# Patient Record
Sex: Male | Born: 1950 | Race: Black or African American | Hispanic: No | Smoking: Former smoker
Health system: Southern US, Community
[De-identification: ages and names within clinical notes are randomized; demographics above are authoritative.]

## PROBLEM LIST (undated history)

## (undated) DIAGNOSIS — Z972 Presence of dental prosthetic device (complete) (partial): Secondary | ICD-10-CM

## (undated) DIAGNOSIS — IMO0001 Reserved for inherently not codable concepts without codable children: Secondary | ICD-10-CM

## (undated) DIAGNOSIS — J302 Other seasonal allergic rhinitis: Secondary | ICD-10-CM

## (undated) DIAGNOSIS — I10 Essential (primary) hypertension: Secondary | ICD-10-CM

## (undated) DIAGNOSIS — J449 Chronic obstructive pulmonary disease, unspecified: Secondary | ICD-10-CM

## (undated) DIAGNOSIS — N400 Enlarged prostate without lower urinary tract symptoms: Secondary | ICD-10-CM

## (undated) DIAGNOSIS — M1990 Unspecified osteoarthritis, unspecified site: Secondary | ICD-10-CM

## (undated) DIAGNOSIS — J45909 Unspecified asthma, uncomplicated: Secondary | ICD-10-CM

## (undated) DIAGNOSIS — K219 Gastro-esophageal reflux disease without esophagitis: Secondary | ICD-10-CM

## (undated) HISTORY — PX: CIRCUMCISION: SUR203

## (undated) HISTORY — PX: DENTAL SURGERY: SHX609

---

## 2005-02-23 ENCOUNTER — Emergency Department: Payer: Self-pay | Admitting: Unknown Physician Specialty

## 2005-03-28 ENCOUNTER — Emergency Department: Payer: Self-pay | Admitting: General Practice

## 2006-05-29 ENCOUNTER — Emergency Department: Payer: Self-pay | Admitting: Emergency Medicine

## 2006-10-11 ENCOUNTER — Emergency Department: Payer: Self-pay

## 2010-02-01 ENCOUNTER — Emergency Department: Payer: Self-pay | Admitting: Emergency Medicine

## 2010-05-06 ENCOUNTER — Emergency Department: Payer: Self-pay | Admitting: Emergency Medicine

## 2010-05-07 ENCOUNTER — Emergency Department: Payer: Self-pay | Admitting: Emergency Medicine

## 2010-05-29 ENCOUNTER — Ambulatory Visit: Payer: Self-pay | Admitting: Family Medicine

## 2010-12-26 ENCOUNTER — Emergency Department: Payer: Self-pay | Admitting: Emergency Medicine

## 2011-02-12 ENCOUNTER — Emergency Department: Payer: Self-pay | Admitting: Emergency Medicine

## 2011-10-13 ENCOUNTER — Inpatient Hospital Stay: Payer: Self-pay | Admitting: Internal Medicine

## 2011-10-28 ENCOUNTER — Emergency Department: Payer: Self-pay | Admitting: Emergency Medicine

## 2011-11-14 ENCOUNTER — Ambulatory Visit: Payer: Self-pay | Admitting: Internal Medicine

## 2012-03-12 ENCOUNTER — Emergency Department: Payer: Self-pay | Admitting: Emergency Medicine

## 2012-03-12 LAB — COMPREHENSIVE METABOLIC PANEL
Albumin: 4.4 g/dL (ref 3.4–5.0)
Alkaline Phosphatase: 37 U/L — ABNORMAL LOW (ref 50–136)
Anion Gap: 10 (ref 7–16)
BUN: 18 mg/dL (ref 7–18)
Calcium, Total: 9 mg/dL (ref 8.5–10.1)
Chloride: 98 mmol/L (ref 98–107)
Co2: 28 mmol/L (ref 21–32)
EGFR (Non-African Amer.): 47 — ABNORMAL LOW
SGOT(AST): 25 U/L (ref 15–37)
Total Protein: 7.3 g/dL (ref 6.4–8.2)

## 2012-03-12 LAB — CBC
HCT: 45.9 % (ref 40.0–52.0)
HGB: 14.7 g/dL (ref 13.0–18.0)
MCHC: 32 g/dL (ref 32.0–36.0)
Platelet: 184 10*3/uL (ref 150–440)
RDW: 13.4 % (ref 11.5–14.5)

## 2012-12-20 ENCOUNTER — Inpatient Hospital Stay: Payer: Self-pay | Admitting: Internal Medicine

## 2012-12-20 LAB — COMPREHENSIVE METABOLIC PANEL WITH GFR
Albumin: 3.9 g/dL (ref 3.4–5.0)
Alkaline Phosphatase: 69 U/L (ref 50–136)
Anion Gap: 8 (ref 7–16)
BUN: 23 mg/dL — ABNORMAL HIGH (ref 7–18)
Bilirubin,Total: 1.5 mg/dL — ABNORMAL HIGH (ref 0.2–1.0)
Calcium, Total: 9.1 mg/dL (ref 8.5–10.1)
Chloride: 100 mmol/L (ref 98–107)
Co2: 29 mmol/L (ref 21–32)
Creatinine: 1.41 mg/dL — ABNORMAL HIGH (ref 0.60–1.30)
EGFR (African American): >60
EGFR (Non-African Amer.): 53 — ABNORMAL LOW
Glucose: 101 mg/dL — ABNORMAL HIGH (ref 65–99)
Osmolality: 278 (ref 275–301)
Potassium: 3.8 mmol/L (ref 3.5–5.1)
SGOT(AST): 29 U/L (ref 15–37)
SGPT (ALT): 23 U/L (ref 12–78)
Sodium: 137 mmol/L (ref 136–145)
Total Protein: 8 g/dL (ref 6.4–8.2)

## 2012-12-20 LAB — CBC
HCT: 53.5 % — ABNORMAL HIGH (ref 40.0–52.0)
HGB: 17.3 g/dL (ref 13.0–18.0)
MCH: 28.5 pg (ref 26.0–34.0)
MCHC: 32.3 g/dL (ref 32.0–36.0)
MCV: 88 fL (ref 80–100)
Platelet: 177 x10 3/mm 3 (ref 150–440)
RBC: 6.08 x10 6/mm 3 — ABNORMAL HIGH (ref 4.40–5.90)
RDW: 15.7 % — ABNORMAL HIGH (ref 11.5–14.5)
WBC: 15 x10 3/mm 3 — ABNORMAL HIGH (ref 3.8–10.6)

## 2012-12-20 LAB — RAPID INFLUENZA A&B ANTIGENS

## 2012-12-20 LAB — MAGNESIUM: Magnesium: 1.9 mg/dL

## 2012-12-20 LAB — TROPONIN I: Troponin-I: <0.02 ng/mL

## 2012-12-21 LAB — CBC WITH DIFFERENTIAL/PLATELET
Basophil #: 0 10*3/uL (ref 0.0–0.1)
Basophil %: 0.4 %
Eosinophil %: 0 %
HCT: 47.5 % (ref 40.0–52.0)
HGB: 15.5 g/dL (ref 13.0–18.0)
Lymphocyte #: 0.6 10*3/uL — ABNORMAL LOW (ref 1.0–3.6)
MCV: 88 fL (ref 80–100)
Monocyte #: 0.2 x10 3/mm (ref 0.2–1.0)
Monocyte %: 2.3 %
Neutrophil #: 9.6 10*3/uL — ABNORMAL HIGH (ref 1.4–6.5)
Platelet: 158 10*3/uL (ref 150–440)
RBC: 5.42 10*6/uL (ref 4.40–5.90)
WBC: 10.5 10*3/uL (ref 3.8–10.6)

## 2012-12-21 LAB — BASIC METABOLIC PANEL
Chloride: 100 mmol/L (ref 98–107)
Co2: 24 mmol/L (ref 21–32)
EGFR (African American): 51 — ABNORMAL LOW
EGFR (Non-African Amer.): 44 — ABNORMAL LOW
Glucose: 186 mg/dL — ABNORMAL HIGH (ref 65–99)
Osmolality: 276 (ref 275–301)
Potassium: 4 mmol/L (ref 3.5–5.1)
Sodium: 132 mmol/L — ABNORMAL LOW (ref 136–145)

## 2012-12-23 LAB — BASIC METABOLIC PANEL
Anion Gap: 5 — ABNORMAL LOW (ref 7–16)
BUN: 23 mg/dL — ABNORMAL HIGH (ref 7–18)
Co2: 26 mmol/L (ref 21–32)
EGFR (African American): >60
EGFR (Non-African Amer.): >60
Glucose: 143 mg/dL — ABNORMAL HIGH (ref 65–99)
Sodium: 136 mmol/L (ref 136–145)

## 2012-12-26 LAB — CULTURE, BLOOD (SINGLE)

## 2014-02-17 ENCOUNTER — Emergency Department: Payer: Self-pay | Admitting: Emergency Medicine

## 2014-03-03 ENCOUNTER — Emergency Department: Payer: Self-pay | Admitting: Emergency Medicine

## 2014-03-03 LAB — CBC
HCT: 49.7 % (ref 40.0–52.0)
HGB: 15.4 g/dL (ref 13.0–18.0)
MCH: 27.2 pg (ref 26.0–34.0)
MCHC: 31 g/dL — AB (ref 32.0–36.0)
MCV: 88 fL (ref 80–100)
PLATELETS: 213 10*3/uL (ref 150–440)
RBC: 5.67 10*6/uL (ref 4.40–5.90)
RDW: 14.8 % — AB (ref 11.5–14.5)
WBC: 14.8 10*3/uL — ABNORMAL HIGH (ref 3.8–10.6)

## 2014-03-03 LAB — BASIC METABOLIC PANEL
Anion Gap: 5 — ABNORMAL LOW (ref 7–16)
BUN: 18 mg/dL (ref 7–18)
Calcium, Total: 9.5 mg/dL (ref 8.5–10.1)
Chloride: 100 mmol/L (ref 98–107)
Co2: 31 mmol/L (ref 21–32)
Creatinine: 1.78 mg/dL — ABNORMAL HIGH (ref 0.60–1.30)
EGFR (Non-African Amer.): 40 — ABNORMAL LOW
GFR CALC AF AMER: 46 — AB
GLUCOSE: 112 mg/dL — AB (ref 65–99)
OSMOLALITY: 275 (ref 275–301)
Potassium: 4 mmol/L (ref 3.5–5.1)
Sodium: 136 mmol/L (ref 136–145)

## 2014-03-03 LAB — URINALYSIS, COMPLETE
BACTERIA: NONE SEEN
BILIRUBIN, UR: NEGATIVE
GLUCOSE, UR: NEGATIVE mg/dL (ref 0–75)
NITRITE: POSITIVE
PH: 8 (ref 4.5–8.0)
Protein: 100
RBC,UR: 147 /HPF (ref 0–5)
Specific Gravity: 1.016 (ref 1.003–1.030)
Squamous Epithelial: NONE SEEN
WBC UR: 1492 /HPF (ref 0–5)

## 2014-03-05 LAB — URINE CULTURE

## 2014-03-12 DIAGNOSIS — N401 Enlarged prostate with lower urinary tract symptoms: Secondary | ICD-10-CM | POA: Insufficient documentation

## 2014-08-18 ENCOUNTER — Emergency Department: Payer: Self-pay | Admitting: Emergency Medicine

## 2014-08-18 LAB — CBC WITH DIFFERENTIAL/PLATELET
Basophil #: 0.1 10*3/uL (ref 0.0–0.1)
Basophil %: 1 %
Eosinophil #: 0 10*3/uL (ref 0.0–0.7)
Eosinophil %: 0.3 %
HCT: 44.8 % (ref 40.0–52.0)
HGB: 14.2 g/dL (ref 13.0–18.0)
Lymphocyte #: 0.6 10*3/uL — ABNORMAL LOW (ref 1.0–3.6)
Lymphocyte %: 9.3 %
MCH: 27.7 pg (ref 26.0–34.0)
MCHC: 31.7 g/dL — AB (ref 32.0–36.0)
MCV: 88 fL (ref 80–100)
Monocyte #: 0.4 x10 3/mm (ref 0.2–1.0)
Monocyte %: 5.7 %
Neutrophil #: 5.6 10*3/uL (ref 1.4–6.5)
Neutrophil %: 83.7 %
PLATELETS: 202 10*3/uL (ref 150–440)
RBC: 5.12 10*6/uL (ref 4.40–5.90)
RDW: 14.5 % (ref 11.5–14.5)
WBC: 6.7 10*3/uL (ref 3.8–10.6)

## 2014-08-18 LAB — BASIC METABOLIC PANEL
Anion Gap: 7 (ref 7–16)
BUN: 20 mg/dL — AB (ref 7–18)
Calcium, Total: 9.2 mg/dL (ref 8.5–10.1)
Chloride: 100 mmol/L (ref 98–107)
Co2: 26 mmol/L (ref 21–32)
Creatinine: 1.39 mg/dL — ABNORMAL HIGH (ref 0.60–1.30)
EGFR (African American): >60
EGFR (Non-African Amer.): 55 — ABNORMAL LOW
GLUCOSE: 153 mg/dL — AB (ref 65–99)
Osmolality: 272 (ref 275–301)
Potassium: 4.1 mmol/L (ref 3.5–5.1)
Sodium: 133 mmol/L — ABNORMAL LOW (ref 136–145)

## 2014-08-18 LAB — URINALYSIS, COMPLETE
Bacteria: NONE SEEN
Bilirubin,UR: NEGATIVE
GLUCOSE, UR: NEGATIVE mg/dL (ref 0–75)
Hyaline Cast: 2
LEUKOCYTE ESTERASE: NEGATIVE
NITRITE: NEGATIVE
Ph: 5 (ref 4.5–8.0)
SPECIFIC GRAVITY: 1.015 (ref 1.003–1.030)
Squamous Epithelial: 1

## 2014-08-18 LAB — TROPONIN I: Troponin-I: <0.02 ng/mL

## 2015-03-04 NOTE — H&P (Signed)
PATIENT NAME:  Frank Todd, Kao H MR#:  [id number] DATE OF BIRTH:  01-05-1951  DATE OF ADMISSION:  12/20/2012   PRIMARY CARE PHYSICIAN:  [facility]  PULMONOLOGIST:  Dr. Carolynne EdouardSadaat Khan  ED REFERRING PHYSICIAN:  Dr. Shaune PollackLord  CHIEF COMPLAINT:  Shortness of breath, cough.   HISTORY OF PRESENT ILLNESS:  The patient is a 64 year old African American male with history of COPD who reports that he has not been feeling well for the past few days. He has had progressive cough with yellow-greenish sputum. He also has had progressive shortness of breath. He normally at baseline is chronically short of breath, but the shortness of breath is worse over the past few days. He has been also wheezing. He has had fevers but has not checked his temperature. The patient does report that he had some antibiotics left over from before. He thinks that it was sulfa antibiotics or likely Bactrim which he took for a few days. He also has prednisone which he has been taking as well without any significant improvement. When he came to the ED his sats were 84% on room air. The patient otherwise denies any chest pains, palpitations, denies any body aches or joint aches. Denies any nausea, vomiting or diarrhea.   PAST MEDICAL HISTORY: 1.  COPD. 2.  Hypertension.  3.  Erectile dysfunction.   PAST SURGICAL HISTORY:  None.   ALLERGIES:  None.   CURRENT MEDICATIONS:  Combivent 2 to 4 puffs q. 6 p.r.n., Spiriva daily, metoprolol extended release 25 p.o. daily. Prednisone, currently he is taking 10 mg. He states that he usually chronically takes steroids. When he runs out, he goes to his primary care provider and they prescribe him prednisone. He reports that without the prednisone, he is unable to breathe and feels very weak. He is on amlodipine 10 daily. Cialis p.r.n. He says he is albuterol nebulizer q. 6 p.r.n.  I am not sure if he is on ipratropium.   SOCIAL HISTORY:  Denies any smoking. States that his COPD is from second-hand  smoke. He reports that he drinks mixed drinks on a daily basis. Denies any drug use. Father had lung cancer. Her mother had lung cancer.  REVIEW OF SYSTEMS:  CONSTITUTIONAL:  Complains of fevers, fatigueness, weakness. No pain. No weight loss. No weight gain.  EYES:  No blurred or double vision. No pain. No redness. No inflammation. No glaucoma. No cataracts.  ENT:  No tinnitus. No ear pain. No hearing loss. No difficulty swallowing.  RESPIRATORY:  Complains of cough, wheezing. No hemoptysis. Has dyspnea. Has COPD.  CARDIOVASCULAR:  Denies any chest pain. No orthopnea. No edema.  GASTROINTESTINAL:  No nausea, vomiting, diarrhea. No abdominal pain. No hematemesis. No changes in bowel habits.  GENITOURINARY:  Denies any dysuria, hematuria, renal calculus or frequency. ENDOCRINE:  Denies any polyuria, nocturia, or thyroid problems.  HEMATOLOGIC AND LYMPHATIC:  Denies anemia, easy bruisability or bleeding.  SKIN:  No acne. No rash. No changes in mole, hair or skin.  MUSCULOSKELETAL:  Denies any pain in the neck, back or shoulder.  NEUROLOGIC: No numbness. No CVA. No TIA. No seizures.  PSYCHIATRIC:  No anxiety. No insomnia. No ADD.   PHYSICAL EXAMINATION: VITAL SIGNS:  Temperature 101.2, pulse 130, respirations 18. Blood pressure was 130/76.  GENERAL:  The patient is an [place] male, appears in mild distress due to his respiratory issues.  HEENT: Head atraumatic, normocephalic. Pupils equally round and react to light and accommodation. There is no conjunctival  pallor. No scleral icterus. Nasal exam shows no drainage or ulceration.  Oropharynx is clear without any exudate.  NECK:  No thyromegaly. No carotid bruits.  CARDIOVASCULAR:  Regular rate and rhythm. Tachycardic. No murmurs, rubs, clicks or gallops. PMI is not displaced.  LUNGS:  He has right-sided wheezing in the mid lung. Some rhonchi, diminished breath sounds bilaterally. Mild accessory muscle usage.  ABDOMEN:  Soft, nontender,  nondistended. Positive bowel sounds x 4.  EXTREMITIES:  No clubbing, cyanosis or edema.  SKIN:  He has some dry, scaly bilateral lower extremity plaque-like lesions which are chronic. LYMPHATICS:  No lymph nodes palpable.  VASCULAR:  Good DP, PT pulses.  PSYCHIATRIC:  Not anxious or depressed.  NEUROLOGICAL:  Awake, alert, oriented x 3. No focal deficits.   LABORATORY RESULTS:  WBC 15.0, hemoglobin 17.3, platelet count 177. LFTs were normal except a bilirubin total of 1.5. Influenza A and B were negative. His BMP:  Glucose 101, BUN 23, creatinine 1.41, sodium 137, potassium 3.8, chloride 100, CO2 is 29.   ASSESSMENT AND PLAN: The patient is a 64 year old African American male with history of chronic obstructive pulmonary disease presents with shortness of breath, cough, fever. Chest x-ray suggestive of a right middle lobe infiltrate.   1.  Acute respiratory failure on chronic respiratory failure likely due to combination of chronic obstructive pulmonary disease exacerbation, as well as pneumonia. At this time we will treat him with nebulizers, IV Solu-Medrol, as well as IV antibiotics with Levaquin and we will get sputum cultures, blood cultures. Will continue Spiriva as taken at home. The patient appears to be chronically on prednisone which will need to continue. If he does not improve, we will consult Dr. Freda Munro, his pulmonologist. 2.  Sinus tachycardia, likely due to nebulizers, as well as his respiratory status. At this time we will monitor him on telemetry. We will continue his Toprol-XL as he is taking at home.  3.  Daily alcohol use, possible alcohol abuse. I will place him on CIWA protocol.  4.  Hypertension:  I will continue metoprolol for time being. Hold Norvasc.  5.  Miscellaneous. I will place him on Lovenox for deep vein thrombosis prophylaxis.   TIME SPENT:  45 minutes   ____________________________ Serita Grit H. Allena Katz, MD shp:ce D: 12/20/2012 15:29:01 ET T: 12/20/2012  16:13:59 ET JOB#: [id number]  cc: Nobuo Nunziata H. Allena Katz, MD, <Dictator> Charise Carwin MD ELECTRONICALLY SIGNED 12/23/2012 10:20

## 2015-03-04 NOTE — Discharge Summary (Signed)
PATIENT NAME:  Frank Todd, Frank Todd MR#:  [id number] DATE OF BIRTH:  [birth year]  DATE OF ADMISSION:  12/20/2012 DATE OF DISCHARGE:  12/24/2012  DISCHARGE DIAGNOSIS: Acute on chronic respiratory failure likely due to a combination of chronic obstructive pulmonary disease exacerbation as well as left lower lobe pneumonia, slowly improving.   SECONDARY DIAGNOSES: 1. Chronic obstructive pulmonary disease.  2. Hypertension.  3. Erectile dysfunction.   LABORATORY AND RADIOLOGICAL DATA:  Chest x-ray on February 8th showed mild left lower lobe infiltrate.  Chest x-ray on February 10th showed mild left basilar opacities slightly decreased from previous x-ray.  Blood cultures x2 were negative. Influenza A and B were negative.   HISTORY AND SHORT HOSPITAL COURSE: The patient is a 64 year old male with the above-mentioned medical problems, was admitted for acute on chronic respiratory failure thought to be secondary to chronic obstructive pulmonary disease exacerbation and/or left lower lobe pneumonia. Please see Dr. Serita GritShreyang Patel's dictated history and physical for further details. The patient had a good improvement on steroid, antibiotic and nebulizer breathing treatments, was close to his baseline on February 12th and was discharged home in stable condition. The patient did have very slow improvement but was feeling comfortable to go home with all Home Health help and was agreeable to the discharge plan.   PERTINENT DISCHARGE PHYSICAL EXAMINATION: VITAL SIGNS: On the date of discharge, temperature was 97.7, heart rate 80 per minute, respirations 22 per minute, blood pressure 138/72 mmHg.  He was saturating 95% on 2.5 to 3 liters oxygen via nasal cannula.  CARDIOVASCULAR: S1, S2 normal. No murmurs, rubs, or gallop.  LUNGS: Decreased breath sounds at the bases. No wheezing, rales, rhonchi, or crepitation.  ABDOMEN: Soft, benign.  NEUROLOGIC: Nonfocal examination.  All other physical examination remained at  baseline.   DISCHARGE MEDICATIONS: 1. Amlodipine 10 mg p.o. daily.  2. Combivent 1 puff twice a day.  3. Prednisone 60 mg p.o. daily, taper x 5 mg daily until finished.  4. Advair 500/50, 1 puff inhaled twice a day.  5. Combivent 1 inhalation 4 times a day as needed.  6. Spiriva once daily.  7. Guaifenesin 600 mg p.o. b.i.d.  8. Levaquin 500 mg p.o. daily for the rest of the course as prescribed.  9. Chlorpheniramine/hydrocodone 5 mL twice a day as needed.   DISCHARGE DIET: Regular.   DISCHARGE ACTIVITY: As tolerated.   DISCHARGE INSTRUCTIONS AND FOLLOWUP:  The patient was [facility] to 2 [facility]. He will need followup with Dr. Freda MunroSaadat Khan from pulmonary in 2 to 4 weeks. He will get 2 liters oxygen via nasal cannula continuous set up by Care Management. He was also set up to get Home Health nurse and nursing aide.   TOTAL TIME DISCHARGING THIS PATIENT: 55 minutes.  ____________________________ Ellamae SiaVipul S. Sherryll BurgerShah, MD vss:cb D: 12/27/2012 15:17:08 ET T: 12/28/2012 08:09:50 ET JOB#: [id number]  cc: Adriene Knipfer S. Sherryll BurgerShah, MD, <Dictator> [facility] Yevonne PaxSaadat A. Khan, MD Ellamae SiaVIPUL S [facility] MD ELECTRONICALLY SIGNED 12/29/2012 19:24

## 2015-03-25 ENCOUNTER — Encounter: Payer: Self-pay | Admitting: *Deleted

## 2015-03-28 NOTE — Discharge Instructions (Signed)
Follow-Up Appointment is: Thursday, May 19 @ 11:05 am    Cataract Surgery Care After Refer to this sheet in the next few weeks. These instructions provide you with information on caring for yourself after your procedure. Your caregiver may also give you more specific instructions. Your treatment has been planned according to current medical practices, but problems sometimes occur. Call your caregiver if you have any problems or questions after your procedure.  HOME CARE INSTRUCTIONS   Avoid strenuous activities as directed by your caregiver.  Ask your caregiver when you can resume driving.  Use eyedrops or other medicines to help healing and control pressure inside your eye as directed by your caregiver.  Only take over-the-counter or prescription medicines for pain, discomfort, or fever as directed by your caregiver.  Do not to touch or rub your eyes.  You may be instructed to use a protective shield during the first few days and nights after surgery. If not, wear sunglasses to protect your eyes. This is to protect the eye from pressure or from being accidentally bumped.  Keep the area around your eye clean and dry. Avoid swimming or allowing water to hit you directly in the face while showering. Keep soap and shampoo out of your eyes.  Do not bend or lift heavy objects. Bending increases pressure in the eye. You can walk, climb stairs, and do light household chores.  Do not put a contact lens into the eye that had surgery until your caregiver says it is okay to do so.  Ask your doctor when you can return to work. This will depend on the kind of work that you do. If you work in a dusty environment, you may be advised to wear protective eyewear for a period of time.  Ask your caregiver when it will be safe to engage in sexual activity.  Continue with your regular eye exams as directed by your caregiver. What to expect:  It is normal to feel itching and mild discomfort for a few days  after cataract surgery. Some fluid discharge is also common, and your eye may be sensitive to light and touch.  After 1 to 2 days, even moderate discomfort should disappear. In most cases, healing will take about 6 weeks.  If you received an intraocular lens (IOL), you may notice that colors are very bright or have a blue tinge. Also, if you have been in bright sunlight, everything may appear reddish for a few hours. If you see these color tinges, it is because your lens is clear and no longer cloudy. Within a few months after receiving an IOL, these extra colors should go away. When you have healed, you will probably need new glasses. SEEK MEDICAL CARE IF:   You have increased bruising around your eye.  You have discomfort not helped by medicine. SEEK IMMEDIATE MEDICAL CARE IF:   You have a fever.  You have a worsening or sudden vision loss.  You have redness, swelling, or increasing pain in the eye.  You have a thick discharge from the eye that had surgery. MAKE SURE YOU:  Understand these instructions.  Will watch your condition.  Will get help right away if you are not doing well or get worse. Document Released: 05/18/2005 Document Revised: 01/21/2012 Document Reviewed: 06/22/2011 [facility] Patient Information 2015 [place]. This information is not intended to replace advice given to you by your health care provider. Make sure you discuss any questions you have with your health care provider.  General Anesthesia, Care After Refer to this sheet in the next few weeks. These instructions provide you with information on caring for yourself after your procedure. Your health care provider may also give you more specific instructions. Your treatment has been planned according to current medical practices, but problems sometimes occur. Call your health care provider if you have any problems or questions after your procedure. WHAT TO EXPECT AFTER THE PROCEDURE After the procedure,  it is typical to experience:  Sleepiness.  Nausea and vomiting. HOME CARE INSTRUCTIONS  For the first 24 hours after general anesthesia:  Have a responsible person with you.  Do not drive a car. If you are alone, do not take public transportation.  Do not drink alcohol.  Do not take medicine that has not been prescribed by your health care provider.  Do not sign important papers or make important decisions.  You may resume a normal diet and activities as directed by your health care provider.  Change bandages (dressings) as directed.  If you have questions or problems that seem related to general anesthesia, call the hospital and ask for the anesthetist or anesthesiologist on call. SEEK MEDICAL CARE IF:  You have nausea and vomiting that continue the day after anesthesia.  You develop a rash. SEEK IMMEDIATE MEDICAL CARE IF:   You have difficulty breathing.  You have chest pain.  You have any allergic problems. Document Released: 02/04/2001 Document Revised: 11/03/2013 Document Reviewed: 05/14/2013 [facility] Patient Information 2015 [place], [place]. This information is not intended to replace advice given to you by your health care provider. Make sure you discuss any questions you have with your health care provider.

## 2015-03-30 ENCOUNTER — Encounter: Payer: Self-pay | Admitting: Anesthesiology

## 2015-03-30 ENCOUNTER — Ambulatory Visit: Payer: Medicare Other | Admitting: Anesthesiology

## 2015-03-30 ENCOUNTER — Ambulatory Visit
Admission: RE | Admit: 2015-03-30 | Discharge: 2015-03-30 | Disposition: A | Discharge status: Home / Self Care | Payer: Medicare Other | Source: Ambulatory Visit | Attending: Ophthalmology | Admitting: Ophthalmology

## 2015-03-30 ENCOUNTER — Encounter
Admission: RE | Disposition: A | Discharge status: Home / Self Care | Payer: Self-pay | Source: Ambulatory Visit | Attending: Ophthalmology

## 2015-03-30 DIAGNOSIS — H2511 Age-related nuclear cataract, right eye: Secondary | ICD-10-CM | POA: Diagnosis not present

## 2015-03-30 DIAGNOSIS — J42 Unspecified chronic bronchitis: Secondary | ICD-10-CM | POA: Insufficient documentation

## 2015-03-30 DIAGNOSIS — Z87891 Personal history of nicotine dependence: Secondary | ICD-10-CM | POA: Insufficient documentation

## 2015-03-30 DIAGNOSIS — H269 Unspecified cataract: Secondary | ICD-10-CM | POA: Diagnosis present

## 2015-03-30 DIAGNOSIS — Z9981 Dependence on supplemental oxygen: Secondary | ICD-10-CM | POA: Insufficient documentation

## 2015-03-30 DIAGNOSIS — M1990 Unspecified osteoarthritis, unspecified site: Secondary | ICD-10-CM | POA: Insufficient documentation

## 2015-03-30 HISTORY — DX: Other seasonal allergic rhinitis: J30.2

## 2015-03-30 HISTORY — DX: Essential (primary) hypertension: I10

## 2015-03-30 HISTORY — PX: CATARACT EXTRACTION W/PHACO: SHX586

## 2015-03-30 HISTORY — DX: Unspecified osteoarthritis, unspecified site: M19.90

## 2015-03-30 HISTORY — DX: Chronic obstructive pulmonary disease, unspecified: J44.9

## 2015-03-30 HISTORY — DX: Presence of dental prosthetic device (complete) (partial): Z97.2

## 2015-03-30 HISTORY — DX: Reserved for inherently not codable concepts without codable children: IMO0001

## 2015-03-30 HISTORY — DX: Unspecified asthma, uncomplicated: J45.909

## 2015-03-30 HISTORY — DX: Benign prostatic hyperplasia without lower urinary tract symptoms: N40.0

## 2015-03-30 HISTORY — DX: Gastro-esophageal reflux disease without esophagitis: K21.9

## 2015-03-30 SURGERY — PHACOEMULSIFICATION, CATARACT, WITH IOL INSERTION
Anesthesia: Monitor Anesthesia Care | Laterality: Right | Wound class: Clean

## 2015-03-30 MED ORDER — ACETAMINOPHEN 325 MG PO TABS: 325.0000 mg | ORAL_TABLET | ORAL | Status: DC | PRN

## 2015-03-30 MED ORDER — BSS IO SOLN
INTRAOCULAR | Status: DC | PRN
  Administered 2015-03-30: 78 mL via INTRAOCULAR
  Administered 2015-03-30: 20 mL via INTRAOCULAR

## 2015-03-30 MED ORDER — ARMC OPHTHALMIC DILATING GEL
1.0000 | OPHTHALMIC | Status: DC | PRN
Start: 2015-03-30 — End: 2015-03-30
  Administered 2015-03-30 (×2): 1 via OPHTHALMIC

## 2015-03-30 MED ORDER — POVIDONE-IODINE 5 % OP SOLN
1.0000 "application " | OPHTHALMIC | Status: DC | PRN
  Administered 2015-03-30: 1 via OPHTHALMIC

## 2015-03-30 MED ORDER — CEFUROXIME OPHTHALMIC INJECTION 1 MG/0.1 ML
INJECTION | OPHTHALMIC | Status: DC | PRN
  Administered 2015-03-30: .3 mL via INTRACAMERAL

## 2015-03-30 MED ORDER — ACETAMINOPHEN 160 MG/5ML PO SOLN: 325.0000 mg | ORAL | Status: DC | PRN

## 2015-03-30 MED ORDER — TIMOLOL MALEATE 0.5 % OP SOLN
OPHTHALMIC | Status: DC | PRN
  Administered 2015-03-30: 1 [drp] via OPHTHALMIC

## 2015-03-30 MED ORDER — BRIMONIDINE TARTRATE 0.2 % OP SOLN
OPHTHALMIC | Status: DC | PRN
  Administered 2015-03-30: 1 [drp] via OPHTHALMIC

## 2015-03-30 MED ORDER — EPINEPHRINE HCL 1 MG/ML IJ SOLN
INTRAMUSCULAR | Status: DC | PRN
  Administered 2015-03-30: 1 mg

## 2015-03-30 MED ORDER — NA HYALUR & NA CHOND-NA HYALUR 0.4-0.35 ML IO KIT
PACK | INTRAOCULAR | Status: DC | PRN
  Administered 2015-03-30: 1 mL via INTRAOCULAR

## 2015-03-30 MED ORDER — FENTANYL CITRATE (PF) 100 MCG/2ML IJ SOLN
INTRAMUSCULAR | Status: DC | PRN
  Administered 2015-03-30 (×2): 50 ug via INTRAVENOUS

## 2015-03-30 MED ORDER — TETRACAINE HCL 0.5 % OP SOLN
1.0000 [drp] | OPHTHALMIC | Status: DC | PRN
  Administered 2015-03-30: 1 [drp] via OPHTHALMIC

## 2015-03-30 MED ORDER — MIDAZOLAM HCL 2 MG/2ML IJ SOLN
INTRAMUSCULAR | Status: DC | PRN
  Administered 2015-03-30: 2 mg via INTRAVENOUS

## 2015-03-30 SURGICAL SUPPLY — 26 items
CANNULA ANT/CHMB 27GA (MISCELLANEOUS) ×3 IMPLANT
CARTRIDGE ABBOTT (MISCELLANEOUS) ×3 IMPLANT
GLOVE SURG LX 7.5 STRW (GLOVE) ×2
GLOVE SURG LX STRL 7.5 STRW (GLOVE) ×1 IMPLANT
GLOVE SURG TRIUMPH 8.0 PF LTX (GLOVE) ×3 IMPLANT
GOWN STRL REUS W/ TWL LRG LVL3 (GOWN DISPOSABLE) ×2 IMPLANT
GOWN STRL REUS W/TWL LRG LVL3 (GOWN DISPOSABLE) ×4
LENS IOL TECNIS 15.5 (Intraocular Lens) ×3 IMPLANT
LENS IOL TECNIS MONO 1P 15.5 (Intraocular Lens) ×1 IMPLANT
MARKER SKIN SURG W/RULER VIO (MISCELLANEOUS) ×3 IMPLANT
NDL RETROBULBAR .5 NSTRL (NEEDLE) IMPLANT
NEEDLE FILTER BLUNT 18X 1/2SAF (NEEDLE) ×2
NEEDLE FILTER BLUNT 18X1 1/2 (NEEDLE) ×1 IMPLANT
PACK CATARACT BRASINGTON (MISCELLANEOUS) ×3 IMPLANT
PACK EYE AFTER SURG (MISCELLANEOUS) ×3 IMPLANT
PACK OPTHALMIC (MISCELLANEOUS) ×3 IMPLANT
RING MALYGIN 7.0 (MISCELLANEOUS) IMPLANT
SUT ETHILON 10-0 CS-B-6CS-B-6 (SUTURE)
SUT VICRYL  9 0 (SUTURE)
SUT VICRYL 9 0 (SUTURE) IMPLANT
SUTURE EHLN 10-0 CS-B-6CS-B-6 (SUTURE) IMPLANT
SYR 3ML LL SCALE MARK (SYRINGE) ×3 IMPLANT
SYR 5ML LL (SYRINGE) IMPLANT
SYR TB 1ML LUER SLIP (SYRINGE) ×3 IMPLANT
WATER STERILE IRR 500ML POUR (IV SOLUTION) ×3 IMPLANT
WIPE NON LINTING 3.25X3.25 (MISCELLANEOUS) ×3 IMPLANT

## 2015-03-30 NOTE — Discharge Summary (Signed)
Hep lock pulled, site dry, intact

## 2015-03-30 NOTE — H&P (Signed)
  The History and Physical notes were scanned in.  The patient remains stable and unchanged from the H&P.   Previous H&P reviewed, patient examined, and there are no changes.  Frank Todd 03/30/2015 8:53 AM

## 2015-03-30 NOTE — Op Note (Signed)
LOCATION:  [facility]   PREOPERATIVE DIAGNOSIS:    Nuclear sclerotic cataract right eye. H25.11   POSTOPERATIVE DIAGNOSIS:  Nuclear sclerotic cataract right eye.     PROCEDURE:  Phacoemusification with posterior chamber intraocular lens placement of the right eye   LENS:   Implant Name Type Inv. Item Serial No. Manufacturer Lot No. LRB No. Used  LENS IMPL INTRAOC [id number] 15.5 - [id number] Intraocular Lens LENS IMPL INTRAOC [id number] 15.5 [id number] AMO   Right 1        ULTRASOUND TIME: 18 % of 1 minutes, 30 seconds.  CDE 16.1   SURGEON:  Deirdre Evenerhadwick R. Sacred Roa, MD   ANESTHESIA:  Topical with tetracaine drops and 2% Xylocaine jelly.   COMPLICATIONS:  None.   DESCRIPTION OF PROCEDURE:  The patient was identified in the holding room and transported to the operating room and placed in the supine position under the operating microscope.  The right eye was identified as the operative eye and it was prepped and draped in the usual sterile ophthalmic fashion.   A 1 millimeter clear-corneal paracentesis was made at the 12:00 position.  The anterior chamber was filled with Viscoat viscoelastic.  A 2.4 millimeter keratome was used to make a near-clear corneal incision at the 9:00 position.  A curvilinear capsulorrhexis was made with a cystotome and capsulorrhexis forceps.  Balanced salt solution was used to hydrodissect and hydrodelineate the nucleus.   Phacoemulsification was then used in stop and chop fashion to remove the lens nucleus and epinucleus.  The remaining cortex was then removed using the irrigation and aspiration handpiece. Provisc was then placed into the capsular bag to distend it for lens placement.  A lens was then injected into the capsular bag.  The remaining viscoelastic was aspirated.   Wounds were hydrated with balanced salt solution.  The anterior chamber was inflated to a physiologic pressure with balanced salt solution.  No wound leaks were noted. Cefuroxime 0.1 ml of a  10mg /ml solution was injected into the anterior chamber for a dose of 1 mg of intracameral antibiotic at the completion of the case.   Timolol and Brimonidine drops were applied to the eye.  The patient was taken to the recovery room in stable condition without complications of anesthesia or surgery.   Frank Todd 03/30/2015, 9:53 AM

## 2015-03-30 NOTE — Transfer of Care (Signed)
Immediate Anesthesia Transfer of Care Note  Patient: Frank RecordsClaude H Mori Jr.  Procedure(s) Performed: Procedure(s): CATARACT EXTRACTION PHACO AND INTRAOCULAR LENS PLACEMENT (IOC) (Right)  Patient Location: PACU  Anesthesia Type: MAC  Level of Consciousness: awake, alert  and patient cooperative  Airway and Oxygen Therapy: Patient Spontanous Breathing and Patient connected to supplemental oxygen  Post-op Assessment: Post-op Vital signs reviewed, Patient's Cardiovascular Status Stable, Respiratory Function Stable, Patent Airway and No signs of Nausea or vomiting  Post-op Vital Signs: Reviewed and stable  Complications: No apparent anesthesia complications

## 2015-03-30 NOTE — Anesthesia Postprocedure Evaluation (Signed)
  Anesthesia Post-op Note  Patient: Frank RecordsClaude H Finelli Jr.  Procedure(s) Performed: Procedure(s): CATARACT EXTRACTION PHACO AND INTRAOCULAR LENS PLACEMENT (IOC) (Right)  Anesthesia type:MAC  Patient location: PACU  Post pain: Pain level controlled  Post assessment: Post-op Vital signs reviewed, Patient's Cardiovascular Status Stable, Respiratory Function Stable, Patent Airway and No signs of Nausea or vomiting  Post vital signs: Reviewed and stable  Last Vitals:  Filed Vitals:   03/30/15 0958  BP: 114/84  Pulse: 78  Temp:   Resp: 13    Level of consciousness: awake, alert  and patient cooperative  Complications: No apparent anesthesia complications

## 2015-03-30 NOTE — Anesthesia Preprocedure Evaluation (Signed)
Anesthesia Evaluation  Patient identified by MRN, date of birth, ID band  Reviewed: Allergy & Precautions, H&P , NPO status , Patient's Chart, lab work & pertinent test results  Airway Mallampati: III  TM Distance: >3 FB Neck ROM: full    Dental no notable dental hx.    Pulmonary shortness of breath and Long-Term Oxygen Therapy, COPD oxygen dependent, former smoker,    Pulmonary exam normal + wheezing      Cardiovascular hypertension, Rhythm:regular Rate:Normal     Neuro/Psych    GI/Hepatic   Endo/Other    Renal/GU      Musculoskeletal   Abdominal   Peds  Hematology   Anesthesia Other Findings   Reproductive/Obstetrics                             Anesthesia Physical Anesthesia Plan  ASA: III  Anesthesia Plan: MAC   Post-op Pain Management:    Induction:   Airway Management Planned:   Additional Equipment:   Intra-op Plan:   Post-operative Plan:   Informed Consent: I have reviewed the patients History and Physical, chart, labs and discussed the procedure including the risks, benefits and alternatives for the proposed anesthesia with the patient or authorized representative who has indicated his/her understanding and acceptance.     Plan Discussed with: CRNA  Anesthesia Plan Comments:         Anesthesia Quick Evaluation

## 2015-03-31 ENCOUNTER — Encounter: Payer: Self-pay | Admitting: Ophthalmology

## 2017-11-07 ENCOUNTER — Other Ambulatory Visit: Payer: Self-pay | Admitting: Internal Medicine

## 2017-11-26 ENCOUNTER — Ambulatory Visit: Payer: Self-pay | Admitting: Internal Medicine

## 2017-11-26 DIAGNOSIS — B370 Candidal stomatitis: Secondary | ICD-10-CM | POA: Insufficient documentation

## 2017-11-26 DIAGNOSIS — I10 Essential (primary) hypertension: Secondary | ICD-10-CM | POA: Insufficient documentation

## 2017-11-26 DIAGNOSIS — J189 Pneumonia, unspecified organism: Secondary | ICD-10-CM | POA: Insufficient documentation

## 2017-11-26 DIAGNOSIS — J9611 Chronic respiratory failure with hypoxia: Secondary | ICD-10-CM | POA: Insufficient documentation

## 2017-11-26 DIAGNOSIS — J9811 Atelectasis: Secondary | ICD-10-CM | POA: Insufficient documentation

## 2017-11-26 DIAGNOSIS — J441 Chronic obstructive pulmonary disease with (acute) exacerbation: Secondary | ICD-10-CM | POA: Insufficient documentation

## 2017-11-26 DIAGNOSIS — R042 Hemoptysis: Secondary | ICD-10-CM | POA: Insufficient documentation

## 2017-11-26 DIAGNOSIS — R0602 Shortness of breath: Secondary | ICD-10-CM | POA: Insufficient documentation

## 2017-11-26 DIAGNOSIS — F17211 Nicotine dependence, cigarettes, in remission: Secondary | ICD-10-CM | POA: Insufficient documentation

## 2017-11-26 DIAGNOSIS — J431 Panlobular emphysema: Secondary | ICD-10-CM | POA: Insufficient documentation

## 2017-11-26 DIAGNOSIS — R911 Solitary pulmonary nodule: Secondary | ICD-10-CM | POA: Insufficient documentation

## 2017-12-18 ENCOUNTER — Encounter: Payer: Self-pay | Admitting: Urology

## 2017-12-18 ENCOUNTER — Ambulatory Visit (INDEPENDENT_AMBULATORY_CARE_PROVIDER_SITE_OTHER): Payer: Medicare Other | Admitting: Urology

## 2017-12-18 VITALS — BP 160/78 | HR 66 | Ht 75.0 in | Wt 146.6 lb

## 2017-12-18 DIAGNOSIS — N401 Enlarged prostate with lower urinary tract symptoms: Secondary | ICD-10-CM

## 2017-12-18 DIAGNOSIS — R3129 Other microscopic hematuria: Secondary | ICD-10-CM

## 2017-12-18 LAB — URINALYSIS, COMPLETE
Bilirubin, UA: NEGATIVE
GLUCOSE, UA: NEGATIVE
Ketones, UA: NEGATIVE
Leukocytes, UA: NEGATIVE
Nitrite, UA: NEGATIVE
PH UA: 6.5 (ref 5.0–7.5)
PROTEIN UA: NEGATIVE
Specific Gravity, UA: 1.015 (ref 1.005–1.030)
Urobilinogen, Ur: 0.2 mg/dL (ref 0.2–1.0)

## 2017-12-18 LAB — MICROSCOPIC EXAMINATION
Bacteria, UA: NONE SEEN
Epithelial Cells (non renal): NONE SEEN /hpf (ref 0–10)
WBC UA: NONE SEEN /HPF (ref 0–?)

## 2017-12-18 MED ORDER — TAMSULOSIN HCL 0.4 MG PO CAPS: 0.4000 mg | ORAL_CAPSULE | Freq: Every day | ORAL | 3 refills | Status: DC

## 2017-12-19 ENCOUNTER — Encounter: Payer: Self-pay | Admitting: Urology

## 2017-12-19 DIAGNOSIS — R3129 Other microscopic hematuria: Secondary | ICD-10-CM | POA: Insufficient documentation

## 2017-12-19 NOTE — Progress Notes (Signed)
12/18/2017 7:14 AM   [name] July 18, 1951 [id number]  Referring provider: No referring provider defined for this encounter.  Chief complaint: Follow-up  HPI: 67 year old male presents for follow-up of BPH and microhematuria.  I last saw him at [facility] in May 2018.  He had started on tamsulosin for lower urinary tract symptoms with significant improvement in his symptoms.  He has a history of microhematuria.  Renal ultrasound was unremarkable.  Cystoscopy was recommended however he never scheduled.  His urinalysis in May did show pyuria and urine culture was positive.  He was treated with antibiotics.  He presents today for follow-up and states he ran out of tamsulosin approximately 3 months ago and has noted recurrent lower urinary tract symptoms.  He is requesting a refill.  He denies flank, abdominal, pelvic or scrotal pain.  He denies gross hematuria.   PMH: Past Medical History:  Diagnosis Date  . Arthritis    shoulders  . Asthma   . Benign prostatic hypertrophy   . COPD (chronic obstructive pulmonary disease) (HCC)    chronic bronchitis  . GERD (gastroesophageal reflux disease)   . Hypertension   . Seasonal allergies   . Shortness of breath dyspnea   . Wears partial dentures    upper    Surgical History: Past Surgical History:  Procedure Laterality Date  . CATARACT EXTRACTION W/PHACO Right 03/30/2015   Procedure: CATARACT EXTRACTION PHACO AND INTRAOCULAR LENS PLACEMENT (IOC);  Surgeon: Lockie Mola, MD;  Location: [facility];  Service: Ophthalmology;  Laterality: Right;  . CIRCUMCISION     as child  . DENTAL SURGERY      Home Medications:  Allergies as of 12/18/2017   No Known Allergies     Medication List        Accurate as of 12/18/17 11:59 PM. Always use your most recent med list.          albuterol 108 (90 Base) MCG/ACT inhaler Commonly known as:  PROVENTIL HFA;VENTOLIN HFA Inhale into the lungs as needed for wheezing or shortness of  breath.   amLODipine 10 MG tablet Commonly known as:  NORVASC Take 10 mg by mouth daily. AM   cetirizine 10 MG tablet Commonly known as:  ZYRTEC Take 10 mg by mouth 2 (two) times daily.   DALIRESP 500 MCG Tabs tablet Generic drug:  roflumilast take 1 tablet by mouth once daily   fluticasone 50 MCG/ACT nasal spray Commonly known as:  FLONASE Place into both nostrils 2 (two) times daily.   Fluticasone-Salmeterol 500-50 MCG/DOSE Aepb Commonly known as:  ADVAIR Inhale 1 puff into the lungs 2 (two) times daily.   metoprolol tartrate 25 MG tablet Commonly known as:  LOPRESSOR Take 25 mg by mouth daily. AM   omeprazole 20 MG capsule Commonly known as:  PRILOSEC Take 20 mg by mouth as needed.   OXYGEN Inhale 2.5 L into the lungs continuous.   tamsulosin 0.4 MG Caps capsule Commonly known as:  FLOMAX Take 1 capsule (0.4 mg total) by mouth daily after breakfast. AM   tiotropium 18 MCG inhalation capsule Commonly known as:  SPIRIVA Place 18 mcg into inhaler and inhale daily. AM       Allergies: No Known Allergies  Family History: History reviewed. No pertinent family history.  Social History:  reports that he quit smoking about 33 years ago. he has never used smokeless tobacco. He reports that he drinks about 1.8 oz of alcohol per week. He reports that he does  not use drugs.  ROS: UROLOGY Frequent Urination?: Yes Hard to postpone urination?: No Burning/pain with urination?: No Get up at night to urinate?: Yes Leakage of urine?: No Urine stream starts and stops?: No Trouble starting stream?: Yes Do you have to strain to urinate?: No Blood in urine?: No Urinary tract infection?: No Sexually transmitted disease?: No Injury to kidneys or bladder?: No Painful intercourse?: No Weak stream?: Yes Erection problems?: No Penile pain?: No  Gastrointestinal Nausea?: No Vomiting?: No Indigestion/heartburn?: No Diarrhea?: No Constipation?:  No  Constitutional Fever: No Night sweats?: No Weight loss?: No Fatigue?: No  Skin Skin rash/lesions?: No Itching?: No  Eyes Blurred vision?: Yes Double vision?: No  Ears/Nose/Throat Sore throat?: No Sinus problems?: No  Hematologic/Lymphatic Swollen glands?: No Easy bruising?: No  Cardiovascular Leg swelling?: No Chest pain?: No  Respiratory Cough?: No Shortness of breath?: No  Endocrine Excessive thirst?: No  Musculoskeletal Back pain?: No Joint pain?: No  Neurological Headaches?: No Dizziness?: No  Psychologic Depression?: No Anxiety?: No  Physical Exam: BP (!) 160/78 (BP Location: Right Arm, Patient Position: Sitting, Cuff Size: Normal)   Pulse 66   Ht 6\' 3"  (1.905 m)   Wt 146 lb 9.6 oz (66.5 kg)   BMI 18.32 kg/m   Constitutional:  Alert and oriented, No acute distress. HEENT: [place] AT, moist mucus membranes.  Trachea midline, no masses. Cardiovascular: No clubbing, cyanosis, or edema. Respiratory: Normal respiratory effort, no increased work of breathing. GI: Abdomen is soft, nontender, nondistended, no abdominal masses GU: No CVA tenderness.  Skin: No rashes, bruises or suspicious lesions. Lymph: No cervical or inguinal adenopathy. Neurologic: Grossly intact, no focal deficits, moving all 4 extremities. Psychiatric: Normal mood and affect.  Laboratory Data: Lab Results  Component Value Date   WBC 6.7 08/18/2014   HGB 14.2 08/18/2014   HCT 44.8 08/18/2014   MCV 88 08/18/2014   PLT 202 08/18/2014    Lab Results  Component Value Date   CREATININE 1.39 (H) 08/18/2014    Urinalysis Lab Results  Component Value Date   SPECGRAV 1.015 12/18/2017   PHUR 6.5 12/18/2017   COLORU Yellow 12/18/2017   APPEARANCEUR Clear 12/18/2017   LEUKOCYTESUR Negative 12/18/2017   PROTEINUR Negative 12/18/2017   GLUCOSEU Negative 12/18/2017   KETONESU Negative 12/18/2017   RBCU 1+ (A) 12/18/2017   BILIRUBINUR Negative 12/18/2017   UUROB 0.2  12/18/2017   NITRITE Negative 12/18/2017    Lab Results  Component Value Date   LABMICR See below: 12/18/2017   WBCUA None seen 12/18/2017   RBCUA 3-10 (A) 12/18/2017   LABEPIT None seen 12/18/2017   BACTERIA None seen 12/18/2017    Assessment & Plan:    1. Microscopic hematuria Urinalysis today with 3-10 RBCs.  I again discussed potential causes of hematuria including bladder cancer and recommended cystoscopy.  He indicated he would schedule.  - Urinalysis, Complete  2.  BPH with lower urinary tract symptoms Tamsulosin was refilled.    Riki AltesScott C Trevonne Nyland, MD   [facility] [address], [address] [place], [place] [address] [phone]

## 2018-01-06 ENCOUNTER — Other Ambulatory Visit: Payer: Self-pay | Admitting: Internal Medicine

## 2018-01-15 ENCOUNTER — Ambulatory Visit (INDEPENDENT_AMBULATORY_CARE_PROVIDER_SITE_OTHER): Payer: Medicare Other | Admitting: Urology

## 2018-01-15 ENCOUNTER — Encounter: Payer: Self-pay | Admitting: Urology

## 2018-01-15 VITALS — BP 125/83 | HR 80 | Wt 137.7 lb

## 2018-01-15 DIAGNOSIS — R3129 Other microscopic hematuria: Secondary | ICD-10-CM | POA: Diagnosis not present

## 2018-01-15 LAB — MICROSCOPIC EXAMINATION

## 2018-01-15 LAB — URINALYSIS, COMPLETE
BILIRUBIN UA: NEGATIVE
GLUCOSE, UA: NEGATIVE
Ketones, UA: NEGATIVE
Nitrite, UA: NEGATIVE
Specific Gravity, UA: 1.02 (ref 1.005–1.030)
UUROB: 0.2 mg/dL (ref 0.2–1.0)
pH, UA: 7 (ref 5.0–7.5)

## 2018-01-15 MED ORDER — CIPROFLOXACIN HCL 500 MG PO TABS: 500.0000 mg | ORAL_TABLET | Freq: Once | ORAL | Status: DC

## 2018-01-15 MED ORDER — LIDOCAINE HCL 2 % EX GEL: 1.0000 "application " | Freq: Once | CUTANEOUS | Status: AC

## 2018-01-15 NOTE — Progress Notes (Signed)
01/15/2018 4:24 PM   Frank Recordslaude H Kydd Jr. 06/11/1951 [id number]  Referring provider: No referring provider defined for this encounter.  Chief Complaint  Patient presents with  . Cysto    HPI: Refer to my prior note dated 12/18/2017.  At that visit Mr. Frank Todd agreed to proceed with cystoscopy however today he stated he did not remember this conversation and is refusing the procedure.  He has no bothersome lower urinary tract symptoms.  Denies gross hematuria.   PMH: Past Medical History:  Diagnosis Date  . Arthritis    shoulders  . Asthma   . Benign prostatic hypertrophy   . COPD (chronic obstructive pulmonary disease) (HCC)    chronic bronchitis  . GERD (gastroesophageal reflux disease)   . Hypertension   . Seasonal allergies   . Shortness of breath dyspnea   . Wears partial dentures    upper    Surgical History: Past Surgical History:  Procedure Laterality Date  . CATARACT EXTRACTION W/PHACO Right 03/30/2015   Procedure: CATARACT EXTRACTION PHACO AND INTRAOCULAR LENS PLACEMENT (IOC);  Surgeon: Lockie Molahadwick Brasington, MD;  Location[facility];  Service: Ophthalmology;  Laterality: Right;  . CIRCUMCISION     as child  . DENTAL SURGERY      Home Medications:  Allergies as of 01/15/2018   No Known Allergies     Medication List        Accurate as of 01/15/18  4:24 PM. Always use your most recent med list.          albuterol 108 (90 Base) MCG/ACT inhaler Commonly known as:  PROVENTIL HFA;VENTOLIN HFA Inhale into the lungs as needed for wheezing or shortness of breath.   amLODipine 10 MG tablet Commonly known as:  NORVASC Take 10 mg by mouth daily. AM   cetirizine 10 MG tablet Commonly known as:  ZYRTEC Take 10 mg by mouth 2 (two) times daily.   DALIRESP 500 MCG Tabs tablet Generic drug:  roflumilast take 1 tablet by mouth once daily   fluticasone 50 MCG/ACT nasal spray Commonly known as:  FLONASE Place into both nostrils 2 (two) times daily.     Fluticasone-Salmeterol 500-50 MCG/DOSE Aepb Commonly known as:  ADVAIR Inhale 1 puff into the lungs 2 (two) times daily.   metoprolol tartrate 25 MG tablet Commonly known as:  LOPRESSOR Take 25 mg by mouth daily. AM   omeprazole 20 MG capsule Commonly known as:  PRILOSEC Take 20 mg by mouth as needed.   OXYGEN Inhale 2.5 L into the lungs continuous.   predniSONE 10 MG tablet Commonly known as:  DELTASONE   SPIRIVA HANDIHALER 18 MCG inhalation capsule Generic drug:  tiotropium inhale 2 puffs by mouth daily   tamsulosin 0.4 MG Caps capsule Commonly known as:  FLOMAX Take 1 capsule (0.4 mg total) by mouth daily after breakfast. AM       Allergies: No Known Allergies  Family History: History reviewed. No pertinent family history.  Social History:  reports that he quit smoking about 33 years ago. he has never used smokeless tobacco. He reports that he drinks about 1.8 oz of alcohol per week. He reports that he does not use drugs.  ROS: No significant change from 12/18/2017  Physical Exam: BP 125/83 (BP Location: Right Arm, Patient Position: Sitting, Cuff Size: Large)   Pulse 80   Wt 137 lb 11.2 oz (62.5 kg)   BMI 17.21 kg/m   Constitutional:  Alert and oriented, No acute distress.   Laboratory  Data: Lab Results  Component Value Date   WBC 6.7 08/18/2014   HGB 14.2 08/18/2014   HCT 44.8 08/18/2014   MCV 88 08/18/2014   PLT 202 08/18/2014    Assessment & Plan:   I stressed the recommendations for cystoscopy as part of the lower tract evaluation for microhematuria including evaluation for urologic malignancies which may be present with microhematuria.  He indicated he understood and was willing to accept the risk of an undiagnosed lower urinary tract malignancy.  His urinalysis today did show 6-10 WBCs.  He is asymptomatic.  A urine culture was ordered.  He has agreed for continued monitoring of his urinalysis and will recheck in 3 months.   Riki Altes,  MD  [facility] [address] [place], [place] [address] [phone]

## 2018-02-08 ENCOUNTER — Encounter: Payer: Self-pay | Admitting: Emergency Medicine

## 2018-02-08 ENCOUNTER — Emergency Department
Admission: EM | Admit: 2018-02-08 | Discharge: 2018-02-08 | Disposition: A | Discharge status: Home / Self Care | Payer: Medicare Other | Attending: Emergency Medicine | Admitting: Emergency Medicine

## 2018-02-08 ENCOUNTER — Other Ambulatory Visit: Payer: Self-pay

## 2018-02-08 DIAGNOSIS — R339 Retention of urine, unspecified: Secondary | ICD-10-CM | POA: Insufficient documentation

## 2018-02-08 DIAGNOSIS — N401 Enlarged prostate with lower urinary tract symptoms: Secondary | ICD-10-CM | POA: Insufficient documentation

## 2018-02-08 DIAGNOSIS — Z87891 Personal history of nicotine dependence: Secondary | ICD-10-CM | POA: Insufficient documentation

## 2018-02-08 DIAGNOSIS — J45909 Unspecified asthma, uncomplicated: Secondary | ICD-10-CM | POA: Diagnosis not present

## 2018-02-08 DIAGNOSIS — Z79899 Other long term (current) drug therapy: Secondary | ICD-10-CM | POA: Insufficient documentation

## 2018-02-08 DIAGNOSIS — I10 Essential (primary) hypertension: Secondary | ICD-10-CM | POA: Insufficient documentation

## 2018-02-08 LAB — URINALYSIS, COMPLETE (UACMP) WITH MICROSCOPIC
BILIRUBIN URINE: NEGATIVE
Bacteria, UA: NONE SEEN
Glucose, UA: NEGATIVE mg/dL
Ketones, ur: NEGATIVE mg/dL
Nitrite: NEGATIVE
Protein, ur: NEGATIVE mg/dL
Specific Gravity, Urine: 1.012 (ref 1.005–1.030)
Squamous Epithelial / LPF: NONE SEEN
pH: 7 (ref 5.0–8.0)

## 2018-02-08 MED ORDER — SULFAMETHOXAZOLE-TRIMETHOPRIM 800-160 MG PO TABS: 1.0000 | ORAL_TABLET | Freq: Two times a day (BID) | ORAL | 0 refills | Status: AC

## 2018-02-08 NOTE — ED Provider Notes (Signed)
[facility] Emergency Department Provider Note  ____________________________________________  Time seen: Approximately 6:00 PM  I have reviewed the triage vital signs and the nursing notes.   HISTORY  Chief Complaint Dysuria    HPI Frank Blunck. is a 67 y.o. male presents to the emergency department with urinary retention for the past 2 days.  Patient reports a history of enlarged prostate and has been taking Flomax.  Patient reports that he is under the care of urology.  Patient reports that approximately 5 years ago, he had to be catheterized for 5 days.  Patient reports no dysuria but is experienced increased urinary frequency.  He denies nausea, vomiting and abdominal pain.  No alleviating measures have been attempted.  Past Medical History:  Diagnosis Date  . Arthritis    shoulders  . Asthma   . Benign prostatic hypertrophy   . COPD (chronic obstructive pulmonary disease) (HCC)    chronic bronchitis  . GERD (gastroesophageal reflux disease)   . Hypertension   . Seasonal allergies   . Shortness of breath dyspnea   . Wears partial dentures    upper    Patient Active Problem List   Diagnosis Date Noted  . Microscopic hematuria 12/19/2017  . Pneumonia 11/26/2017  . Chronic respiratory failure with hypoxia (HCC) 11/26/2017  . Candidal stomatitis 11/26/2017  . Atelectasis 11/26/2017  . Solitary pulmonary nodule 11/26/2017  . Panlobular emphysema (HCC) 11/26/2017  . Chronic obstructive pulmonary disease with acute exacerbation (HCC) 11/26/2017  . 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 Hemoptysis 11/26/2017  . Shortness of breath 11/26/2017  . Hypertension, essential 11/26/2017  . Benign prostatic hyperplasia with lower urinary tract symptoms 03/12/2014    Past Surgical History:  Procedure Laterality Date  . CATARACT EXTRACTION W/PHACO Right 03/30/2015   Procedure: CATARACT EXTRACTION PHACO AND INTRAOCULAR LENS PLACEMENT  (IOC);  Surgeon: Lockie Mola, MD;  Location: [facility];  Service: Ophthalmology;  Laterality: Right;  . CIRCUMCISION     as child  . DENTAL SURGERY      Prior to Admission medications   Medication Sig Start Date End Date Taking? Authorizing Provider  albuterol (PROVENTIL HFA;VENTOLIN HFA) 108 (90 BASE) MCG/ACT inhaler Inhale into the lungs as needed for wheezing or shortness of breath.    [provider]  amLODipine (NORVASC) 10 MG tablet Take 10 mg by mouth daily. AM    [provider]  cetirizine (ZYRTEC) 10 MG tablet Take 10 mg by mouth 2 (two) times daily.    [provider]  DALIRESP 500 MCG TABS tablet take 1 tablet by mouth once daily 11/08/17   Yevonne Pax, MD  fluticasone [facility]) 50 MCG/ACT nasal spray Place into both nostrils 2 (two) times daily.    [provider]  Fluticasone-Salmeterol (ADVAIR) 500-50 MCG/DOSE AEPB Inhale 1 puff into the lungs 2 (two) times daily.    [provider]  metoprolol tartrate (LOPRESSOR) 25 MG tablet Take 25 mg by mouth daily. AM    [provider]  omeprazole (PRILOSEC) 20 MG capsule Take 20 mg by mouth as needed.    [provider]  OXYGEN Inhale 2.5 L into the lungs continuous.    [provider]  predniSONE (DELTASONE) 10 MG tablet  01/06/18   [provider]  [facility] HANDIHALER 18 MCG inhalation capsule inhale 2 puffs by mouth daily 01/07/18   Yevonne Pax, MD  sulfamethoxazole-trimethoprim (BACTRIM DS,SEPTRA DS) 800-160 MG tablet Take 1 tablet by  mouth 2 (two) times daily for 7 days. 02/08/18 02/15/18  Orvil Feil, PA-C  tamsulosin (FLOMAX) 0.4 MG CAPS capsule Take 1 capsule (0.4 mg total) by mouth daily after breakfast. AM 12/18/17   Stoioff, Verna Czech, MD    Allergies Patient has no known allergies.  No family history on file.  Social History Social History   Tobacco Use  . Smoking status: Former Smoker    Last attempt to quit: 11/12/1984     Years since quitting: 33.2  . Smokeless tobacco: Never Used  Substance Use Topics  . Alcohol use: Yes    Alcohol/week: 1.8 oz    Types: 3 Shots of liquor per week    Comment: pt said "several pints per week"  . Drug use: No     Review of Systems  Constitutional: No fever/chills Eyes: No visual changes. No discharge ENT: No upper respiratory complaints. Cardiovascular: no chest pain. Respiratory: no cough. No SOB. Gastrointestinal: No abdominal pain.  No nausea, no vomiting.  No diarrhea.  No constipation. Genitourinary: Patient has urinary retention and increased urinary frequency.  Musculoskeletal: Negative for musculoskeletal pain. Skin: Negative for rash, abrasions, lacerations, ecchymosis. Neurological: Negative for headaches, focal weakness or numbness. ______________________________________   PHYSICAL EXAM:  VITAL SIGNS: ED Triage Vitals  Enc Vitals Group     BP 02/08/18 1602 126/89     Pulse Rate 02/08/18 1602 71     Resp 02/08/18 1602 20     Temp 02/08/18 1602 98.6 F (37 C)     Temp Source 02/08/18 1602 Oral     SpO2 02/08/18 1602 95 %     Weight 02/08/18 1603 170 lb (77.1 kg)     Height 02/08/18 1603 6' (1.829 m)     Head Circumference --      Peak Flow --      Pain Score 02/08/18 1603 0     Pain Loc --      Pain Edu? --      Excl. in GC? --      Constitutional: Alert and oriented. Well appearing and in no acute distress. Eyes: Conjunctivae are normal. PERRL. EOMI. Head: Atraumatic. Cardiovascular: Normal rate, regular rhythm. Normal S1 and S2.  Good peripheral circulation. Respiratory: Normal respiratory effort without tachypnea or retractions. Lungs CTAB. Good air entry to the bases with no decreased or absent breath sounds. Gastrointestinal: Bowel sounds 4 quadrants. Soft and nontender to palpation. No guarding or rigidity. No palpable masses. No distention. No CVA tenderness. Musculoskeletal: Full range of motion to all extremities. No gross  deformities appreciated. Neurologic:  Normal speech and language. No gross focal neurologic deficits are appreciated.  Skin:  Skin is warm, dry and intact. No rash noted. Psychiatric: Mood and affect are normal. Speech and behavior are normal. Patient exhibits appropriate insight and judgement.   ____________________________________________   LABS (all labs ordered are listed, but only abnormal results are displayed)  Labs Reviewed  URINALYSIS, COMPLETE (UACMP) WITH MICROSCOPIC - Abnormal; Notable for the following components:      Result Value   Color, Urine YELLOW (*)    APPearance HAZY (*)    Hgb urine dipstick SMALL (*)    Leukocytes, UA TRACE (*)    All other components within normal limits   ____________________________________________  EKG   ____________________________________________  RADIOLOGY   No results found.  ____________________________________________    PROCEDURES  Procedure(s) performed:    Procedures    Medications - No data to display  ____________________________________________   INITIAL IMPRESSION / ASSESSMENT AND PLAN / ED COURSE  Pertinent labs & imaging results that were available during my care of the patient were reviewed by me and considered in my medical decision making (see chart for details).  Review of the [place] CSRS was performed in accordance of the [facility] prior to dispensing any controlled drugs.    Assessment and Plan: Urinary retention Differential diagnosis originally included urinary retention secondary to prostate enlargement and prostatitis.  Patient was catheterized in the emergency department after bladder scan revealed 500 cc of urine within the bladder after patient has attempted to void numerous times in the emergency department.  Patient was advised to increase his dose of Flomax to 0.8 mg daily until he can follow-up with urology this week.  Patient was treated empirically for prostatitis with bactrim given  leukocytes identified on urinalysis and nature of urinary retention.  Vital signs are reassuring prior to discharge.  Patient was advised to follow-up with urology as soon as possible.    ____________________________________________  FINAL CLINICAL IMPRESSION(S) / ED DIAGNOSES  Final diagnoses:  Urinary retention      NEW MEDICATIONS STARTED DURING THIS VISIT:  ED Discharge Orders        Ordered    sulfamethoxazole-trimethoprim (BACTRIM DS,SEPTRA DS) 800-160 MG tablet  2 times daily     02/08/18 1850          This chart was dictated using voice recognition software/Dragon. Despite best efforts to proofread, errors can occur which can change the meaning. Any change was purely unintentional.    Orvil FeilWoods, Lakiyah Arntson M, PA-C 02/08/18 2134    Sharyn CreamerQuale, Mark, MD 02/11/18 0100

## 2018-02-08 NOTE — ED Triage Notes (Signed)
Frequent small urinations since last night. Denies pain with urination

## 2018-02-08 NOTE — ED Notes (Signed)
Three bladder scans performed. 494 ml,504 ml, >55311ml.

## 2018-02-13 ENCOUNTER — Other Ambulatory Visit: Payer: Self-pay | Admitting: Internal Medicine

## 2018-02-14 ENCOUNTER — Other Ambulatory Visit: Payer: Self-pay

## 2018-02-14 MED ORDER — ALBUTEROL SULFATE HFA 108 (90 BASE) MCG/ACT IN AERS: 2.0000 | INHALATION_SPRAY | Freq: Four times a day (QID) | RESPIRATORY_TRACT | 5 refills | Status: DC | PRN

## 2018-02-14 MED ORDER — PREDNISONE 10 MG PO TABS: 10.0000 mg | ORAL_TABLET | Freq: Every day | ORAL | 1 refills | Status: DC

## 2018-02-19 ENCOUNTER — Ambulatory Visit: Payer: Medicare Other | Admitting: Family Medicine

## 2018-02-19 VITALS — BP 118/74 | HR 109 | Ht 75.0 in | Wt 150.0 lb

## 2018-02-19 DIAGNOSIS — R339 Retention of urine, unspecified: Secondary | ICD-10-CM

## 2018-02-19 NOTE — Progress Notes (Signed)
Pt presents today for a V&T. Foley was removed without difficulty. Pt tolerated well. Reinforced with pt to RTC today by 3pm for bladder scan. Pt voiced understanding.   There was a soft, squishy, golf ball sized knot at top of the penis. Pt stated the knot popped up when the urinary retention issue started. Pt made a f/u with Dr. Lonna CobbStoioff at check out.   Blood pressure 118/74, pulse (!) 109, height 6\' 3"  (1.905 m), weight 150 lb (68 kg).

## 2018-03-26 ENCOUNTER — Ambulatory Visit: Payer: Medicare Other | Admitting: Urology

## 2018-03-31 ENCOUNTER — Inpatient Hospital Stay
Admission: EM | Admit: 2018-03-31 | Discharge: 2018-04-02 | DRG: 190 | Disposition: A | Discharge status: Home Health | Payer: Medicare Other | Attending: Internal Medicine | Admitting: Internal Medicine

## 2018-03-31 ENCOUNTER — Encounter: Payer: Self-pay | Admitting: Emergency Medicine

## 2018-03-31 ENCOUNTER — Other Ambulatory Visit: Payer: Self-pay

## 2018-03-31 ENCOUNTER — Emergency Department: Payer: Medicare Other

## 2018-03-31 DIAGNOSIS — N401 Enlarged prostate with lower urinary tract symptoms: Secondary | ICD-10-CM | POA: Diagnosis present

## 2018-03-31 DIAGNOSIS — Z9841 Cataract extraction status, right eye: Secondary | ICD-10-CM

## 2018-03-31 DIAGNOSIS — I10 Essential (primary) hypertension: Secondary | ICD-10-CM | POA: Diagnosis present

## 2018-03-31 DIAGNOSIS — Z681 Body mass index (BMI) 19 or less, adult: Secondary | ICD-10-CM | POA: Diagnosis not present

## 2018-03-31 DIAGNOSIS — J441 Chronic obstructive pulmonary disease with (acute) exacerbation: Secondary | ICD-10-CM | POA: Diagnosis present

## 2018-03-31 DIAGNOSIS — Z9981 Dependence on supplemental oxygen: Secondary | ICD-10-CM

## 2018-03-31 DIAGNOSIS — T380X6A Underdosing of glucocorticoids and synthetic analogues, initial encounter: Secondary | ICD-10-CM | POA: Diagnosis present

## 2018-03-31 DIAGNOSIS — Y92009 Unspecified place in unspecified non-institutional (private) residence as the place of occurrence of the external cause: Secondary | ICD-10-CM | POA: Diagnosis not present

## 2018-03-31 DIAGNOSIS — R338 Other retention of urine: Secondary | ICD-10-CM | POA: Diagnosis present

## 2018-03-31 DIAGNOSIS — Z91138 Patient's unintentional underdosing of medication regimen for other reason: Secondary | ICD-10-CM | POA: Diagnosis not present

## 2018-03-31 DIAGNOSIS — Z87891 Personal history of nicotine dependence: Secondary | ICD-10-CM

## 2018-03-31 DIAGNOSIS — Z7951 Long term (current) use of inhaled steroids: Secondary | ICD-10-CM

## 2018-03-31 DIAGNOSIS — Z7952 Long term (current) use of systemic steroids: Secondary | ICD-10-CM | POA: Diagnosis not present

## 2018-03-31 DIAGNOSIS — J9621 Acute and chronic respiratory failure with hypoxia: Secondary | ICD-10-CM | POA: Diagnosis present

## 2018-03-31 DIAGNOSIS — Z961 Presence of intraocular lens: Secondary | ICD-10-CM | POA: Diagnosis present

## 2018-03-31 DIAGNOSIS — E43 Unspecified severe protein-calorie malnutrition: Secondary | ICD-10-CM | POA: Diagnosis present

## 2018-03-31 DIAGNOSIS — J9600 Acute respiratory failure, unspecified whether with hypoxia or hypercapnia: Secondary | ICD-10-CM | POA: Diagnosis present

## 2018-03-31 DIAGNOSIS — K219 Gastro-esophageal reflux disease without esophagitis: Secondary | ICD-10-CM | POA: Diagnosis present

## 2018-03-31 LAB — BASIC METABOLIC PANEL
Anion gap: 5 (ref 5–15)
BUN: 12 mg/dL (ref 6–20)
CHLORIDE: 100 mmol/L — AB (ref 101–111)
CO2: 31 mmol/L (ref 22–32)
CREATININE: 1.09 mg/dL (ref 0.61–1.24)
Calcium: 8.4 mg/dL — ABNORMAL LOW (ref 8.9–10.3)
GFR calc Af Amer: >60 mL/min (ref >60)
GFR calc non Af Amer: >60 mL/min (ref >60)
GLUCOSE: 100 mg/dL — AB (ref 65–99)
POTASSIUM: 3.6 mmol/L (ref 3.5–5.1)
Sodium: 136 mmol/L (ref 135–145)

## 2018-03-31 LAB — CBC WITH DIFFERENTIAL/PLATELET
Basophils Absolute: 0.1 10*3/uL (ref 0–0.1)
Basophils Relative: 1 %
EOS ABS: 0.1 10*3/uL (ref 0–0.7)
Eosinophils Relative: 2 %
HEMATOCRIT: 39.7 % — AB (ref 40.0–52.0)
HEMOGLOBIN: 12.6 g/dL — AB (ref 13.0–18.0)
Lymphocytes Relative: 37 %
Lymphs Abs: 2.6 10*3/uL (ref 1.0–3.6)
MCH: 27.5 pg (ref 26.0–34.0)
MCHC: 31.8 g/dL — ABNORMAL LOW (ref 32.0–36.0)
MCV: 86.4 fL (ref 80.0–100.0)
MONOS PCT: 10 %
Monocytes Absolute: 0.7 10*3/uL (ref 0.2–1.0)
NEUTROS PCT: 50 %
Neutro Abs: 3.5 10*3/uL (ref 1.4–6.5)
Platelets: 204 10*3/uL (ref 150–440)
RBC: 4.59 MIL/uL (ref 4.40–5.90)
RDW: 15.2 % — ABNORMAL HIGH (ref 11.5–14.5)
WBC: 7 10*3/uL (ref 3.8–10.6)

## 2018-03-31 LAB — TROPONIN I

## 2018-03-31 LAB — BRAIN NATRIURETIC PEPTIDE: B Natriuretic Peptide: 75 pg/mL (ref 0.0–100.0)

## 2018-03-31 MED ORDER — ONDANSETRON HCL 4 MG PO TABS: 4.0000 mg | ORAL_TABLET | Freq: Four times a day (QID) | ORAL | Status: DC | PRN

## 2018-03-31 MED ORDER — ALBUTEROL SULFATE (2.5 MG/3ML) 0.083% IN NEBU
7.5000 mg | INHALATION_SOLUTION | Freq: Once | RESPIRATORY_TRACT | Status: AC
  Administered 2018-03-31: 7.5 mg via RESPIRATORY_TRACT
  Filled 2018-03-31: qty 9

## 2018-03-31 MED ORDER — TIOTROPIUM BROMIDE MONOHYDRATE 18 MCG IN CAPS
18.0000 ug | ORAL_CAPSULE | Freq: Every day | RESPIRATORY_TRACT | Status: DC
  Administered 2018-03-31 – 2018-04-02 (×3): 18 ug via RESPIRATORY_TRACT
  Filled 2018-03-31: qty 5

## 2018-03-31 MED ORDER — SODIUM CHLORIDE 0.9% FLUSH: 3.0000 mL | INTRAVENOUS | Status: DC | PRN

## 2018-03-31 MED ORDER — AMLODIPINE BESYLATE 10 MG PO TABS
10.0000 mg | ORAL_TABLET | Freq: Every day | ORAL | Status: DC
  Administered 2018-03-31 – 2018-04-02 (×3): 10 mg via ORAL
  Filled 2018-03-31 (×3): qty 1

## 2018-03-31 MED ORDER — SODIUM CHLORIDE 0.9 % IV SOLN: 250.0000 mL | INTRAVENOUS | Status: DC | PRN

## 2018-03-31 MED ORDER — GUAIFENESIN ER 600 MG PO TB12
600.0000 mg | ORAL_TABLET | Freq: Two times a day (BID) | ORAL | Status: DC
  Administered 2018-03-31 – 2018-04-02 (×4): 600 mg via ORAL
  Filled 2018-03-31 (×4): qty 1

## 2018-03-31 MED ORDER — PANTOPRAZOLE SODIUM 40 MG PO TBEC
40.0000 mg | DELAYED_RELEASE_TABLET | Freq: Every day | ORAL | Status: DC
  Administered 2018-03-31 – 2018-04-02 (×3): 40 mg via ORAL
  Filled 2018-03-31 (×3): qty 1

## 2018-03-31 MED ORDER — BUDESONIDE 0.25 MG/2ML IN SUSP
0.2500 mg | Freq: Two times a day (BID) | RESPIRATORY_TRACT | Status: DC
  Administered 2018-03-31 – 2018-04-02 (×4): 0.25 mg via RESPIRATORY_TRACT
  Filled 2018-03-31 (×4): qty 2

## 2018-03-31 MED ORDER — SODIUM CHLORIDE 0.9 % IV SOLN
500.0000 mg | Freq: Once | INTRAVENOUS | Status: AC
  Administered 2018-03-31: 500 mg via INTRAVENOUS
  Filled 2018-03-31: qty 500

## 2018-03-31 MED ORDER — SODIUM CHLORIDE 0.9% FLUSH
3.0000 mL | Freq: Two times a day (BID) | INTRAVENOUS | Status: DC
  Administered 2018-03-31 – 2018-04-02 (×5): 3 mL via INTRAVENOUS

## 2018-03-31 MED ORDER — ACETAMINOPHEN 650 MG RE SUPP: 650.0000 mg | Freq: Four times a day (QID) | RECTAL | Status: DC | PRN

## 2018-03-31 MED ORDER — ACETAMINOPHEN 325 MG PO TABS: 650.0000 mg | ORAL_TABLET | Freq: Four times a day (QID) | ORAL | Status: DC | PRN

## 2018-03-31 MED ORDER — FLUTICASONE PROPIONATE 50 MCG/ACT NA SUSP
2.0000 | Freq: Every day | NASAL | Status: DC
  Administered 2018-03-31 – 2018-04-02 (×3): 2 via NASAL
  Filled 2018-03-31: qty 16

## 2018-03-31 MED ORDER — IPRATROPIUM-ALBUTEROL 0.5-2.5 (3) MG/3ML IN SOLN
3.0000 mL | RESPIRATORY_TRACT | Status: DC
  Administered 2018-03-31 – 2018-04-02 (×11): 3 mL via RESPIRATORY_TRACT
  Filled 2018-03-31 (×11): qty 3

## 2018-03-31 MED ORDER — METHYLPREDNISOLONE SODIUM SUCC 125 MG IJ SOLR
60.0000 mg | Freq: Four times a day (QID) | INTRAMUSCULAR | Status: DC
  Administered 2018-03-31 – 2018-04-02 (×8): 60 mg via INTRAVENOUS
  Filled 2018-03-31 (×7): qty 2

## 2018-03-31 MED ORDER — AZITHROMYCIN 500 MG PO TABS
500.0000 mg | ORAL_TABLET | Freq: Every day | ORAL | Status: DC
  Administered 2018-04-01 – 2018-04-02 (×2): 500 mg via ORAL
  Filled 2018-03-31 (×2): qty 1

## 2018-03-31 MED ORDER — LORATADINE 10 MG PO TABS
10.0000 mg | ORAL_TABLET | Freq: Every day | ORAL | Status: DC
  Administered 2018-03-31 – 2018-04-02 (×3): 10 mg via ORAL
  Filled 2018-03-31 (×3): qty 1

## 2018-03-31 MED ORDER — TAMSULOSIN HCL 0.4 MG PO CAPS
0.4000 mg | ORAL_CAPSULE | Freq: Every day | ORAL | Status: DC
  Administered 2018-04-01: 0.4 mg via ORAL
  Filled 2018-03-31: qty 1

## 2018-03-31 MED ORDER — MAGNESIUM SULFATE 2 GM/50ML IV SOLN
2.0000 g | Freq: Once | INTRAVENOUS | Status: AC
Start: 2018-03-31 — End: 2018-03-31
  Administered 2018-03-31: 2 g via INTRAVENOUS
  Filled 2018-03-31: qty 50

## 2018-03-31 MED ORDER — ONDANSETRON HCL 4 MG/2ML IJ SOLN: 4.0000 mg | Freq: Four times a day (QID) | INTRAMUSCULAR | Status: DC | PRN

## 2018-03-31 MED ORDER — ENOXAPARIN SODIUM 40 MG/0.4ML ~~LOC~~ SOLN
40.0000 mg | SUBCUTANEOUS | Status: DC
  Administered 2018-03-31: 40 mg via SUBCUTANEOUS
  Filled 2018-03-31: qty 0.4

## 2018-03-31 NOTE — ED Notes (Addendum)
Pt resting comfortably at this time. BIPAP removed by respiratory and MD's orders. Pt on 3L nasal cannula at this time. Respirations even and unlabored.

## 2018-03-31 NOTE — Progress Notes (Signed)
Textpaged Dr. Auburn Bilberry to DC bipap

## 2018-03-31 NOTE — Progress Notes (Signed)
Pt taken off bipap and placed on 2.5L [place], sats 95%, respiratory rate 24/min, pt tolerating well at this time. Will continue to monitor.

## 2018-03-31 NOTE — ED Provider Notes (Signed)
[facility] Emergency Department Provider Note  ___________________________________________   First MD Initiated Contact with Patient 03/31/18 1003     (approximate)  I have reviewed the triage vital signs and the nursing notes.   HISTORY  Chief Complaint Shortness of Breath   HPI Frank Todd. is a 67 y.o. male with a history of COPD on baseline of 2-1/2 L of nasal cannula oxygen who is presenting to the emergency department today with worsening shortness of breath at this past Friday.  He says that this past Friday he ran out of his daily prednisone.  He says that since then he has had worsening shortness of breath.  He denies any pain.  Also notes slightly increased swelling to his left leg which is usually more swollen than the right.  He was never noted to be hypoxic by EMS and is on his baseline 2.5 L of nasal cannula oxygen and saturating 98%.  However, he had severely decreased air movement along with use of accessory muscles.  Given 2 DuoNeb's as well as Solu-Medrol in route.  Past Medical History:  Diagnosis Date  . Arthritis    shoulders  . Asthma   . Benign prostatic hypertrophy   . COPD (chronic obstructive pulmonary disease) (HCC)    chronic bronchitis  . GERD (gastroesophageal reflux disease)   . Hypertension   . Seasonal allergies   . Shortness of breath dyspnea   . Wears partial dentures    upper    Patient Active Problem List   Diagnosis Date Noted  . Microscopic hematuria 12/19/2017  . Pneumonia 11/26/2017  . Chronic respiratory failure with hypoxia (HCC) 11/26/2017  . Candidal stomatitis 11/26/2017  . Atelectasis 11/26/2017  . Solitary pulmonary nodule 11/26/2017  . Panlobular emphysema (HCC) 11/26/2017  . Chronic obstructive pulmonary disease with acute exacerbation (HCC) 11/26/2017  . 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 Hemoptysis 11/26/2017  . Shortness of breath 11/26/2017  . Hypertension,  essential 11/26/2017  . Benign prostatic hyperplasia with lower urinary tract symptoms 03/12/2014    Past Surgical History:  Procedure Laterality Date  . CATARACT EXTRACTION W/PHACO Right 03/30/2015   Procedure: CATARACT EXTRACTION PHACO AND INTRAOCULAR LENS PLACEMENT (IOC);  Surgeon: Lockie Mola, MD;  Location: [facility];  Service: Ophthalmology;  Laterality: Right;  . CIRCUMCISION     as child  . DENTAL SURGERY      Prior to Admission medications   Medication Sig Start Date End Date Taking? Authorizing Provider  albuterol (PROVENTIL HFA;VENTOLIN HFA) 108 (90 BASE) MCG/ACT inhaler Inhale into the lungs as needed for wheezing or shortness of breath.    [provider]  albuterol (VENTOLIN HFA) 108 (90 Base) MCG/ACT inhaler Inhale 2 puffs into the lungs every 6 (six) hours as needed for wheezing or shortness of breath. 02/14/18   Yevonne Pax, MD  Albuterol Sulfate (VENTOLIN HFA IN) Inhale 1 Inhaler into the lungs every 6 (six) hours as needed.    [provider]  amLODipine (NORVASC) 10 MG tablet Take 10 mg by mouth daily. AM    [provider]  cetirizine (ZYRTEC) 10 MG tablet Take 10 mg by mouth 2 (two) times daily.    [provider]  DALIRESP 500 MCG TABS tablet take 1 tablet by mouth once daily 11/08/17   Yevonne Pax, MD  fluticasone [facility]) 50 MCG/ACT nasal spray Place into both nostrils 2 (two) times daily.    [provider]  Fluticasone-Salmeterol (  ADVAIR) 500-50 MCG/DOSE AEPB Inhale 1 puff into the lungs 2 (two) times daily.    [provider]  metoprolol tartrate (LOPRESSOR) 25 MG tablet Take 25 mg by mouth daily. AM    [provider]  omeprazole (PRILOSEC) 20 MG capsule Take 20 mg by mouth as needed.    [provider]  OXYGEN Inhale 2.5 L into the lungs continuous.    [provider]  predniSONE (DELTASONE) 10 MG tablet take 1 tablet by mouth once daily 02/14/18   Carlean Jews, NP  predniSONE (DELTASONE) 10 MG tablet Take 1 tablet (10 mg total) by mouth daily. 02/14/18   Yevonne Pax, MD  SPIRIVA HANDIHALER 18 MCG inhalation capsule inhale 2 puffs by mouth daily 01/07/18   Yevonne Pax, MD  tamsulosin [facility]) 0.4 MG CAPS capsule Take 1 capsule (0.4 mg total) by mouth daily after breakfast. AM 12/18/17   Riki Altes, MD    Allergies Patient has no known allergies.  No family history on file.  Social History Social History   Tobacco Use  . Smoking status: Former Smoker    Last attempt to quit: 11/12/1984    Years since quitting: 33.4  . Smokeless tobacco: Never Used  Substance Use Topics  . Alcohol use: Yes    Alcohol/week: 1.8 oz    Types: 3 Shots of liquor per week    Comment: pt said "several pints per week"  . Drug use: No    Review of Systems  Constitutional: No fever/chills Eyes: No visual changes. ENT: No sore throat. Cardiovascular: Denies chest pain. Respiratory: As above. Gastrointestinal: No abdominal pain.  No nausea, no vomiting.  No diarrhea.  No constipation. Genitourinary: Negative for dysuria. Musculoskeletal: Negative for back pain. Skin: Negative for rash. Neurological: Negative for headaches, focal weakness or numbness.   ____________________________________________   PHYSICAL EXAM:  VITAL SIGNS: ED Triage Vitals  Enc Vitals Group     BP      Pulse      Resp      Temp      Temp src      SpO2      Weight      Height      Head Circumference      Peak Flow      Pain Score      Pain Loc      Pain Edu?      Excl. in GC?     Constitutional: Alert and oriented.  Peaks in full sentences but with respiratory distress. Eyes: Conjunctivae are normal.  Head: Atraumatic. Nose: No congestion/rhinnorhea. Mouth/Throat: Mucous membranes are moist.  Neck: No stridor.   Cardiovascular: Normal rate, regular rhythm. Grossly normal heart sounds.  Respiratory: Labored respirations with use of accessory muscles  and intercostal retractions.  Mild belly breathing as well.  Severely decreased air movement throughout all fields with minimal wheezing throughout. Gastrointestinal: Soft and nontender. No distention. No CVA tenderness. Musculoskeletal: Minimal right lower extremity edema with moderate left lower extremity edema to the ankle and calf. Neurologic:  Normal speech and language. No gross focal neurologic deficits are appreciated. Skin:  Skin is warm, dry and intact. No rash noted. Psychiatric: Mood and affect are normal. Speech and behavior are normal.  ____________________________________________   LABS (all labs ordered are listed, but only abnormal results are displayed)  Labs Reviewed  CBC WITH DIFFERENTIAL/PLATELET  BASIC METABOLIC PANEL  TROPONIN I  BRAIN NATRIURETIC PEPTIDE   ____________________________________________  EKG  ED ECG REPORT I, Arelia Longest, the attending physician, personally viewed and interpreted this ECG.   Date: 03/31/2018  EKG Time: 1008  Rate: 77  Rhythm: normal sinus rhythm  Axis: Normal  Intervals:none  ST&T Change: T wave inversions in aVL.  Also in V2.  No ST elevation or depression. No significant change from previous. ____________________________________________  RADIOLOGY Chest x-ray with evidence of emphysema but without any focal consolidation. ____________________________________________   PROCEDURES  Procedure(s) performed:   .Critical Care Performed by: Myrna Blazer, MD Authorized by: Myrna Blazer, MD   Critical care provider statement:    Critical care time (minutes):  35   Critical care time was exclusive of:  Separately billable procedures and treating other patients   Critical care was necessary to treat or prevent imminent or life-threatening deterioration of the following conditions:  Respiratory failure   Critical care was time spent personally by me on the following activities:  Development  of treatment plan with patient or surrogate, discussions with consultants, evaluation of patient's response to treatment, examination of patient, obtaining history from patient or surrogate, ordering and performing treatments and interventions, ordering and review of laboratory studies, ordering and review of radiographic studies, pulse oximetry, re-evaluation of patient's condition and review of old charts    Critical Care performed:    ____________________________________________   INITIAL IMPRESSION / ASSESSMENT AND PLAN / ED COURSE  Pertinent labs & imaging results that were available during my care of the patient were reviewed by me and considered in my medical decision making (see chart for details).  Differential includes, but is not limited to, viral syndrome, bronchitis including COPD exacerbation, pneumonia, reactive airway disease including asthma, CHF including exacerbation with or without pulmonary/interstitial edema, pneumothorax, ACS, thoracic trauma, and pulmonary embolism. As part of my medical decision making, I reviewed the following data within the electronic MEDICAL RECORD NUMBER Notes from prior ED visits   ----------------------------------------- 10:52 AM on 03/31/2018 -----------------------------------------  Patient tolerating the BiPAP well without any further use of accessory muscles.  Patient will be admitted to the hospital for further treatment for COPD.  Signed out to Dr. Allena Katz. ____________________________________________   FINAL CLINICAL IMPRESSION(S) / ED DIAGNOSES  COPD exacerbation.    NEW MEDICATIONS STARTED DURING THIS VISIT:  New Prescriptions   No medications on file     Note:  This document was prepared using Dragon voice recognition software and may include unintentional dictation errors.     Myrna Blazer, MD 03/31/18 1052

## 2018-03-31 NOTE — ED Notes (Addendum)
Pt given urinal.

## 2018-03-31 NOTE — H&P (Signed)
[facility] [place] [facility]   PATIENT NAME: Frank Todd    MR#:  [id number]  DATE OF BIRTH:  [birth year]  DATE OF ADMISSION:  03/31/2018  PRIMARY CARE PHYSICIAN: System, Provider Not In   REQUESTING/REFERRING PHYSICIAN: Myrna Blazer, MD  CHIEF COMPLAINT:   Chief Complaint  Patient presents with  . Shortness of Breath    HISTORY OF PRESENT ILLNESS: Frank Todd  is a 67 y.o. male with a known history of COPD on chronic oxygen therapy with 2-1/2 L of oxygen presenting with worsening shortness of breath since Friday.  Patient states that he ran out of his daily prednisone on Friday and since then has had worsening shortness of breath.  Patient came to the ER had to initially be placed on BiPAP however his breathing is improved and were able to remove him off BiPAP.  Chest x-ray is negative.  He denies any fevers chills has chronic cough. PAST MEDICAL HISTORY:   Past Medical History:  Diagnosis Date  . Arthritis    shoulders  . Asthma   . Benign prostatic hypertrophy   . COPD (chronic obstructive pulmonary disease) (HCC)    chronic bronchitis  . GERD (gastroesophageal reflux disease)   . Hypertension   . Seasonal allergies   . Shortness of breath dyspnea   . Wears partial dentures    upper    PAST SURGICAL HISTORY:  Past Surgical History:  Procedure Laterality Date  . CATARACT EXTRACTION W/PHACO Right 03/30/2015   Procedure: CATARACT EXTRACTION PHACO AND INTRAOCULAR LENS PLACEMENT (IOC);  Surgeon: Lockie Mola, MD;  Location: [facility];  Service: Ophthalmology;  Laterality: Right;  . CIRCUMCISION     as child  . DENTAL SURGERY      SOCIAL HISTORY:  Social History   Tobacco Use  . Smoking status: Former Smoker    Last attempt to quit: 11/12/1984    Years since quitting: 33.4  . Smokeless tobacco: Never Used  Substance Use Topics  . Alcohol use: Yes    Alcohol/week: 1.8 oz    Types: 3 Shots of liquor per week    Comment:  pt said "several pints per week"    FAMILY HISTORY: No family history on file.  DRUG ALLERGIES: No Known Allergies  REVIEW OF SYSTEMS:   CONSTITUTIONAL: No fever, fatigue or weakness.  EYES: No blurred or double vision.  EARS, NOSE, AND THROAT: No tinnitus or ear pain.  RESPIRATORY: Chronic cough, positive shortness of breath, positive wheezing or hemoptysis.  CARDIOVASCULAR: No chest pain, orthopnea, edema.  GASTROINTESTINAL: No nausea, vomiting, diarrhea or abdominal pain.  GENITOURINARY: No dysuria, hematuria.  ENDOCRINE: No polyuria, nocturia,  HEMATOLOGY: No anemia, easy bruising or bleeding SKIN: No rash or lesion. MUSCULOSKELETAL: No joint pain or arthritis.   NEUROLOGIC: No tingling, numbness, weakness.  PSYCHIATRY: No anxiety or depression.   MEDICATIONS AT HOME:  Prior to Admission medications   Medication Sig Start Date End Date Taking? Authorizing Provider  albuterol (PROVENTIL HFA;VENTOLIN HFA) 108 (90 BASE) MCG/ACT inhaler Inhale into the lungs as needed for wheezing or shortness of breath.    [provider]  albuterol (VENTOLIN HFA) 108 (90 Base) MCG/ACT inhaler Inhale 2 puffs into the lungs every 6 (six) hours as needed for wheezing or shortness of breath. 02/14/18   Yevonne Pax, MD  Albuterol Sulfate (VENTOLIN HFA IN) Inhale 1 Inhaler into the lungs every 6 (six) hours as needed.    [provider]  amLODipine (NORVASC)  10 MG tablet Take 10 mg by mouth daily. AM    [provider]  cetirizine (ZYRTEC) 10 MG tablet Take 10 mg by mouth 2 (two) times daily.    [provider]  DALIRESP 500 MCG TABS tablet take 1 tablet by mouth once daily 11/08/17   Yevonne Pax, MD  fluticasone [facility]) 50 MCG/ACT nasal spray Place into both nostrils 2 (two) times daily.    [provider]  Fluticasone-Salmeterol (ADVAIR) 500-50 MCG/DOSE AEPB Inhale 1 puff into the lungs 2 (two) times daily.    [provider]  metoprolol  tartrate (LOPRESSOR) 25 MG tablet Take 25 mg by mouth daily. AM    [provider]  omeprazole (PRILOSEC) 20 MG capsule Take 20 mg by mouth as needed.    [provider]  OXYGEN Inhale 2.5 L into the lungs continuous.    [provider]  predniSONE (DELTASONE) 10 MG tablet take 1 tablet by mouth once daily 02/14/18   Carlean Jews, NP  predniSONE (DELTASONE) 10 MG tablet Take 1 tablet (10 mg total) by mouth daily. 02/14/18   Yevonne Pax, MD  SPIRIVA HANDIHALER 18 MCG inhalation capsule inhale 2 puffs by mouth daily 01/07/18   Yevonne Pax, MD  tamsulosin [facility]) 0.4 MG CAPS capsule Take 1 capsule (0.4 mg total) by mouth daily after breakfast. AM 12/18/17   Stoioff, Verna Czech, MD      PHYSICAL EXAMINATION:   VITAL SIGNS: Blood pressure (!) 157/109, pulse 80, temperature 97.7 F (36.5 C), temperature source Axillary, resp. rate 18, height 6' (1.829 m), weight 68 kg (150 lb), SpO2 99 %.  GENERAL:  67 y.o.-year-old patient lying in the bed with no acute distress.  EYES: Pupils equal, round, reactive to light and accommodation. No scleral icterus. Extraocular muscles intact.  HEENT: Head atraumatic, normocephalic. Oropharynx and nasopharynx clear.  NECK:  Supple, no jugular venous distention. No thyroid enlargement, no tenderness.  LUNGS: Bilateral wheezing throughout both lungs no accessory muscle usage CARDIOVASCULAR: S1, S2 normal. No murmurs, rubs, or gallops.  ABDOMEN: Soft, nontender, nondistended. Bowel sounds present. No organomegaly or mass.  EXTREMITIES: No pedal edema, cyanosis, or clubbing.  NEUROLOGIC: Cranial nerves II through XII are intact. Muscle strength 5/5 in all extremities. Sensation intact. Gait not checked.  PSYCHIATRIC: The patient is alert and oriented x 3.  SKIN: No obvious rash, lesion, or ulcer.   LABORATORY PANEL:   CBC Recent Labs  Lab 03/31/18 1012  WBC 7.0  HGB 12.6*  HCT 39.7*  PLT 204  MCV 86.4  MCH 27.5  MCHC 31.8*   RDW 15.2*  LYMPHSABS 2.6  MONOABS 0.7  EOSABS 0.1  BASOSABS 0.1   ------------------------------------------------------------------------------------------------------------------  Chemistries  Recent Labs  Lab 03/31/18 1012  NA 136  K 3.6  CL 100*  CO2 31  GLUCOSE 100*  BUN 12  CREATININE 1.09  CALCIUM 8.4*   ------------------------------------------------------------------------------------------------------------------ estimated creatinine clearance is 62.4 mL/min (by C-G formula based on SCr of 1.09 mg/dL). ------------------------------------------------------------------------------------------------------------------ No results for input(s): TSH, T4TOTAL, T3FREE, THYROIDAB in the last 72 hours.  Invalid input(s): FREET3   Coagulation profile No results for input(s): INR, PROTIME in the last 168 hours. ------------------------------------------------------------------------------------------------------------------- No results for input(s): DDIMER in the last 72 hours. -------------------------------------------------------------------------------------------------------------------  Cardiac Enzymes Recent Labs  Lab 03/31/18 1012  TROPONINI <0.03   ------------------------------------------------------------------------------------------------------------------ Invalid input(s): POCBNP  ---------------------------------------------------------------------------------------------------------------  Urinalysis    Component Value Date/Time   COLORURINE YELLOW (A) 02/08/2018 1612   APPEARANCEUR  HAZY (A) 02/08/2018 1612   APPEARANCEUR Cloudy (A) 01/15/2018 1533   LABSPEC 1.012 02/08/2018 1612   LABSPEC 1.015 08/18/2014 2100   PHURINE 7.0 02/08/2018 1612   GLUCOSEU NEGATIVE 02/08/2018 1612   GLUCOSEU Negative 08/18/2014 2100   HGBUR SMALL (A) 02/08/2018 1612   BILIRUBINUR NEGATIVE 02/08/2018 1612   BILIRUBINUR Negative 01/15/2018 1533   BILIRUBINUR  Negative 08/18/2014 2100   KETONESUR NEGATIVE 02/08/2018 1612   PROTEINUR NEGATIVE 02/08/2018 1612   NITRITE NEGATIVE 02/08/2018 1612   LEUKOCYTESUR TRACE (A) 02/08/2018 1612   LEUKOCYTESUR Trace (A) 01/15/2018 1533   LEUKOCYTESUR Negative 08/18/2014 2100     RADIOLOGY: No results found.  EKG: Orders placed or performed during the hospital encounter of 03/31/18  . ED EKG  . ED EKG  . EKG 12-Lead  . EKG 12-Lead    IMPRESSION AND PLAN: Patient is a 67 year old African-American male with COPD chronic respiratory failure presenting with worsening shortness of breath  1.  Acute on chronic respiratory failure due to acute on chronic COPD exacerbation We will treat patient with nebulizer therapy I will add Pulmicort to current regimen We will treat with IV Solu-Medrol Oral azithromycin for bronchitis  2.  BPH continue flomax  3. Seasonal allergies : Resume Flonase and Zyrtec  4.  Miscellaneous Lovenox for DVT prophylaxis    All the records are reviewed and case discussed with ED provider. Management plans discussed with the patient, family and they are in agreement.  CODE STATUS: Full    TOTAL TIME TAKING CARE OF THIS PATIENT: .    Auburn Bilberry M.D on 03/31/2018 at 10:48 AM  Between 7am to 6pm - Pager - [phone]  After 6pm go to [email or web address] - password [email or web address]  [employer] [facility] Office  [phone]  CC: Primary care physician; System, Provider Not In

## 2018-03-31 NOTE — Progress Notes (Signed)
Advanced care plan.  Purpose of the Encounter: CODE STATUS  Parties in Attendance: patient   Patient's Decision Capacity: intact  Subjective/Patient's story: Patient 66 year old with history of COPD chronic respiratory failure presenting with acute respiratory failure requiring BiPAP in the emergency room    Objective/Medical story I discussed with the patient regarding his chronic respiratory failure.  I discussed the CODE STATUS.  I asked the patient and explained him about CPR and what it entails.  I also explained to him mechanical ventilation.  He states that he would like everything done   Goals of care determination: Full code    CODE STATUS: Full code   Time spent discussing advanced care planning: 16 minutes

## 2018-03-31 NOTE — ED Triage Notes (Signed)
Pt arrived via ems from home with complaints of shortness of breath. PT take prednisone chronically and has been out of it since Friday with increases shortness of breath since. PT given 2 duo nebs in route. Pt's breathing remains labored upon arrival to ED

## 2018-04-01 LAB — BASIC METABOLIC PANEL
Anion gap: 6 (ref 5–15)
BUN: 16 mg/dL (ref 6–20)
CHLORIDE: 102 mmol/L (ref 101–111)
CO2: 27 mmol/L (ref 22–32)
Calcium: 8.3 mg/dL — ABNORMAL LOW (ref 8.9–10.3)
Creatinine, Ser: 1.21 mg/dL (ref 0.61–1.24)
GFR calc Af Amer: >60 mL/min (ref >60)
GFR, EST NON AFRICAN AMERICAN: 60 mL/min — AB (ref >60)
GLUCOSE: 179 mg/dL — AB (ref 65–99)
POTASSIUM: 4.2 mmol/L (ref 3.5–5.1)
Sodium: 135 mmol/L (ref 135–145)

## 2018-04-01 LAB — CBC
HCT: 37.9 % — ABNORMAL LOW (ref 40.0–52.0)
HEMOGLOBIN: 12 g/dL — AB (ref 13.0–18.0)
MCH: 27.6 pg (ref 26.0–34.0)
MCHC: 31.8 g/dL — ABNORMAL LOW (ref 32.0–36.0)
MCV: 87 fL (ref 80.0–100.0)
Platelets: 199 10*3/uL (ref 150–440)
RBC: 4.36 MIL/uL — AB (ref 4.40–5.90)
RDW: 15 % — ABNORMAL HIGH (ref 11.5–14.5)
WBC: 7.4 10*3/uL (ref 3.8–10.6)

## 2018-04-01 MED ORDER — TIOTROPIUM BROMIDE MONOHYDRATE 18 MCG IN CAPS: 18.0000 ug | ORAL_CAPSULE | Freq: Every day | RESPIRATORY_TRACT | 0 refills | Status: DC

## 2018-04-01 MED ORDER — TAMSULOSIN HCL 0.4 MG PO CAPS
0.8000 mg | ORAL_CAPSULE | Freq: Every day | ORAL | Status: DC
  Administered 2018-04-01: 19:00:00 0.8 mg via ORAL
  Filled 2018-04-01: qty 2

## 2018-04-01 MED ORDER — PREDNISONE 10 MG (21) PO TBPK: ORAL_TABLET | ORAL | 0 refills | Status: DC

## 2018-04-01 MED ORDER — PREDNISONE 10 MG PO TABS: 10.0000 mg | ORAL_TABLET | Freq: Every day | ORAL | 0 refills | Status: DC

## 2018-04-01 MED ORDER — ENOXAPARIN SODIUM 30 MG/0.3ML ~~LOC~~ SOLN
30.0000 mg | SUBCUTANEOUS | Status: DC
  Administered 2018-04-01: 21:00:00 30 mg via SUBCUTANEOUS
  Filled 2018-04-01: qty 0.3

## 2018-04-01 MED ORDER — FLUTICASONE-SALMETEROL 500-50 MCG/DOSE IN AEPB: 1.0000 | INHALATION_SPRAY | Freq: Two times a day (BID) | RESPIRATORY_TRACT | 0 refills | Status: DC

## 2018-04-01 MED ORDER — ALBUTEROL SULFATE HFA 108 (90 BASE) MCG/ACT IN AERS: 2.0000 | INHALATION_SPRAY | Freq: Four times a day (QID) | RESPIRATORY_TRACT | 1 refills | Status: DC | PRN

## 2018-04-01 NOTE — Progress Notes (Signed)
Pt having difficulty emptying bladder  Only voids  100 ml at a time  And strains to void. md notified  Pt bladder scanned and 695  Noted on scan. I/o cath done and 850 ml  Drained. md notified.  monitering pt  For ability to void.

## 2018-04-01 NOTE — Plan of Care (Signed)
  Problem: Education: Goal: Knowledge of General Education information will improve Outcome: Progressing   Problem: Education: Goal: Knowledge of disease or condition will improve Outcome: Progressing Goal: Knowledge of the prescribed therapeutic regimen will improve Outcome: Progressing   Problem: Activity: Goal: Ability to tolerate increased activity will improve Outcome: Progressing Goal: Will verbalize the importance of balancing activity with adequate rest periods Outcome: Progressing   Problem: Respiratory: Goal: Ability to maintain a clear airway will improve Outcome: Progressing Goal: Levels of oxygenation will improve Outcome: Progressing Goal: Ability to maintain adequate ventilation will improve Outcome: Progressing   

## 2018-04-01 NOTE — Progress Notes (Signed)
[facility] - [place] at [facility]   PATIENT NAME: Frank Todd    MR#:  [id number]  DATE OF BIRTH:  11/09/1951  SUBJECTIVE:  CHIEF COMPLAINT:   Chief Complaint  Patient presents with  . Shortness of Breath   SOB with some improvement Still has wheezing REVIEW OF SYSTEMS:    Review of Systems  Constitutional: Positive for malaise/fatigue. Negative for chills and fever.  HENT: Negative for sore throat.   Eyes: Negative for blurred vision, double vision and pain.  Respiratory: Positive for cough, shortness of breath and wheezing. Negative for hemoptysis.   Cardiovascular: Negative for chest pain, palpitations, orthopnea and leg swelling.  Gastrointestinal: Negative for abdominal pain, constipation, diarrhea, heartburn, nausea and vomiting.  Genitourinary: Negative for dysuria and hematuria.  Musculoskeletal: Negative for back pain and joint pain.  Skin: Negative for rash.  Neurological: Negative for sensory change, speech change, focal weakness and headaches.  Endo/Heme/Allergies: Does not bruise/bleed easily.  Psychiatric/Behavioral: Negative for depression. The patient is not nervous/anxious.     DRUG ALLERGIES:  No Known Allergies  VITALS:  Blood pressure (!) 144/85, pulse (!) 117, temperature 98.3 F (36.8 C), temperature source Oral, resp. rate 17, height  (1.905 m), weight 55.6 kg (122 lb 9.2 oz), SpO2 100 %.  PHYSICAL EXAMINATION:   Physical Exam  GENERAL:  67 y.o.-year-old patient lying in the bed with no acute distress.  EYES: Pupils equal, round, reactive to light and accommodation. No scleral icterus. Extraocular muscles intact.  HEENT: Head atraumatic, normocephalic. Oropharynx and nasopharynx clear.  NECK:  Supple, no jugular venous distention. No thyroid enlargement, no tenderness.  LUNGS: b/l wheezing and decreased air entry CARDIOVASCULAR: S1, S2 normal. No murmurs, rubs, or gallops.  ABDOMEN: Soft, nontender, nondistended. Bowel  sounds present. No organomegaly or mass.  EXTREMITIES: No cyanosis, clubbing or edema b/l.    NEUROLOGIC: Cranial nerves II through XII are intact. No focal Motor or sensory deficits b/l.   PSYCHIATRIC: The patient is alert and oriented x 3.  SKIN: No obvious rash, lesion, or ulcer.   LABORATORY PANEL:   CBC Recent Labs  Lab 04/01/18 0634  WBC 7.4  HGB 12.0*  HCT 37.9*  PLT 199   ------------------------------------------------------------------------------------------------------------------ Chemistries  Recent Labs  Lab 04/01/18 0634  NA 135  K 4.2  CL 102  CO2 27  GLUCOSE 179*  BUN 16  CREATININE 1.21  CALCIUM 8.3*   ------------------------------------------------------------------------------------------------------------------  Cardiac Enzymes Recent Labs  Lab 03/31/18 1012  TROPONINI <0.03   ------------------------------------------------------------------------------------------------------------------  RADIOLOGY:  Dg Chest 1 View  Result Date: 03/31/2018 CLINICAL DATA:  67 year old male with known COPD and shortness of breath EXAM: CHEST  1 VIEW COMPARISON:  Prior chest x-ray 03/03/2014 FINDINGS: Stable cardiac and mediastinal contours. The lungs are hyperinflated with mild bronchitic changes. No evidence of pneumothorax, pulmonary edema, pleural effusion or focal airspace consolidation. No acute osseous abnormality. IMPRESSION: Hyperinflation with likely underlying emphysema consistent with the clinical history of COPD. No acute cardiopulmonary process. Electronically Signed   By: Malachy Moan M.D.   On: 03/31/2018 10:50     ASSESSMENT AND PLAN:   * Acute on chronic resp failure secondary to COPD exacerbation -IV steroids - Scheduled Nebulizers - Inhalers -Wean O2 as tolerated - Consult pulmonary if no improvement  * Urinary retention due to BPH Increase flomax dose In and out cath once If further issues then will need foley   All the  records are reviewed and case discussed with Care Management/Social  Workerr. Management plans discussed with the patient, family and they are in agreement.  CODE STATUS: FULL CODE  DVT Prophylaxis: SCDs  TOTAL TIME TAKING CARE OF THIS PATIENT: 35 minutes.   POSSIBLE D/C IN 1-2 DAYS, DEPENDING ON CLINICAL CONDITION.  Molinda Bailiff Sebastion Jun M.D on 04/01/2018 at 11:12 PM  Between 7am to 6pm - Pager - [phone]  After 6pm go to [email or web address] - password [email or web address]  [facility] [place] [facility]  Office  [phone]  CC: Primary care physician; System, Provider Not In  Note: This dictation was prepared with Dragon dictation along with smaller phrase technology. Any transcriptional errors that result from this process are unintentional.

## 2018-04-01 NOTE — Progress Notes (Signed)
Anticoagulation monitoring(Lovenox):  67yo  M ordered Lovenox 40 mg Q24h  Filed Weights   03/31/18 1006 03/31/18 1257  Weight: 150 lb (68 kg) 122 lb 9.2 oz (55.6 kg)   BMI 15.32   Lab Results  Component Value Date   CREATININE 1.21 04/01/2018   CREATININE 1.09 03/31/2018   CREATININE 1.39 (H) 08/18/2014   Estimated Creatinine Clearance: 46 mL/min (by C-G formula based on SCr of 1.21 mg/dL). Hemoglobin & Hematocrit     Component Value Date/Time   HGB 12.0 (L) 04/01/2018 0634   HGB 14.2 08/18/2014 1942   HCT 37.9 (L) 04/01/2018 0634   HCT 44.8 08/18/2014 1942     Per Protocol for Patient with estCrcl > 30 ml/min and weight < 57 kg, will transition to Lovenox 30 mg Q24h     Bari Mantis PharmD Clinical Pharmacist 04/01/2018

## 2018-04-02 MED ORDER — IPRATROPIUM-ALBUTEROL 0.5-2.5 (3) MG/3ML IN SOLN
3.0000 mL | Freq: Four times a day (QID) | RESPIRATORY_TRACT | Status: DC
  Administered 2018-04-02: 3 mL via RESPIRATORY_TRACT
  Filled 2018-04-02: qty 3

## 2018-04-02 MED ORDER — TIOTROPIUM BROMIDE MONOHYDRATE 18 MCG IN CAPS
18.0000 ug | ORAL_CAPSULE | Freq: Every day | RESPIRATORY_TRACT | Status: DC
  Filled 2018-04-02: qty 5

## 2018-04-02 MED ORDER — ENSURE ENLIVE PO LIQD: 237.0000 mL | Freq: Two times a day (BID) | ORAL | Status: DC

## 2018-04-02 NOTE — Progress Notes (Signed)
Initial Nutrition Assessment  DOCUMENTATION CODES:   Severe malnutrition in context of chronic illness, Underweight  INTERVENTION:  Recommend liberalizing diet to regular.  Provide Ensure Enlive po BID, each supplement provides 350 kcal and 20 grams of protein.  Encouraged adequate intake of calories and protein from meals, snacks, and oral nutrition supplements to prevent any further unintentional weight loss or loss of lean body mass. Discussed choosing foods that are calorie- and protein-dense.  NUTRITION DIAGNOSIS:   Severe Malnutrition related to chronic illness(COPD) as evidenced by severe fat depletion, severe muscle depletion.  GOAL:   Patient will meet greater than or equal to 90% of their needs, Weight gain  MONITOR:   PO intake, Supplement acceptance, Labs, Weight trends, I & O's  REASON FOR ASSESSMENT:        ASSESSMENT:   67 year old male with PMHx of COPD, asthma, HTN, arthritis, BPH, GERD who was admitted with acute exacerbation of COPD and urinary retention due to BPH.   Met with patient at bedside. He reports he has had a decreased appetite for a few weeks. He is unsure what is causing it. He denies any N/V, abdominal pain, constipation/diarrhea, or difficulty chewing/swallowing. Patient reports he has partial dentures. He typically eats 2-3 meals per day. He reports he has been eating less at his meals than usual PTA. He feels his appetite has improved here and he is eating better.   UBW 130-135 lbs. Patient was 137.7 lbs on 01/15/2018 and has lost 15.1 lbs (11% body weight) over the past almost 3 months, which is significant for time frame.  Medications reviewed and include: azithromycin, Solu-Medrol 60 mg Q6hrs IV, pantoprazole, Flomax.  Labs reviewed.  Discussed with RN.  NUTRITION - FOCUSED PHYSICAL EXAM:    Most Recent Value  Orbital Region  Severe depletion  Upper Arm Region  Severe depletion  Thoracic and Lumbar Region  Severe depletion  Buccal  Region  Severe depletion  Temple Region  Severe depletion  Clavicle Bone Region  Severe depletion  Clavicle and Acromion Bone Region  Severe depletion  Scapular Bone Region  Severe depletion  Dorsal Hand  Severe depletion  Patellar Region  Severe depletion  Anterior Thigh Region  Severe depletion  Posterior Calf Region  Severe depletion  Edema (RD Assessment)  -- [non-pitting]  Hair  Reviewed  Eyes  Reviewed  Mouth  Reviewed  Skin  Reviewed  Nails  Reviewed     Diet Order:   Diet Order           Diet regular Room service appropriate? Yes; Fluid consistency: Thin  Diet effective now          EDUCATION NEEDS:   Education needs have been addressed  Skin:  Skin Assessment: Reviewed RN Assessment  Last BM:  04/01/2018  Height:   Ht Readings from Last 1 Encounters:  03/31/18 6' 3"  (1.905 m)    Weight:   Wt Readings from Last 1 Encounters:  03/31/18 122 lb 9.2 oz (55.6 kg)    Ideal Body Weight:  89.1 kg  BMI:  Body mass index is 15.32 kg/m.  Estimated Nutritional Needs:   Kcal:  1670-1950 (30-35 kcal/kg)  Protein:  85-95 grams (1.5-1.7 grams/kg)  Fluid:  1.7-2 L/day (1 mL/kcal)  Willey Blade, MS, RD, LDN Office: [phone] Pager: [phone] After Hours/Weekend Pager: [phone]

## 2018-04-02 NOTE — Discharge Instructions (Signed)
Resume diet and activity as before ° ° °

## 2018-04-02 NOTE — Progress Notes (Signed)
Pt for discharge home instructions discussed with pt. Meds/ diet / activity and f/u discussed.  Verbalizes understanding.  Sl d/cd. Earlier.  Pt on 02 at home.   Pt waiting for sister to come and pick him up.

## 2018-04-02 NOTE — Plan of Care (Signed)
  Problem: Education: Goal: Knowledge of General Education information will improve Outcome: Progressing   Problem: Education: Goal: Knowledge of disease or condition will improve Outcome: Progressing Goal: Knowledge of the prescribed therapeutic regimen will improve Outcome: Progressing   Problem: Activity: Goal: Ability to tolerate increased activity will improve Outcome: Progressing Goal: Will verbalize the importance of balancing activity with adequate rest periods Outcome: Progressing   Problem: Respiratory: Goal: Ability to maintain a clear airway will improve Outcome: Progressing Goal: Levels of oxygenation will improve Outcome: Progressing Goal: Ability to maintain adequate ventilation will improve Outcome: Progressing  Pt has to go home with foley catheter d/u to urinary retention. Will f/u op  with dr Oneida Alar

## 2018-04-02 NOTE — Care Management (Signed)
Patient for discharge home.  he would benefit from home health nurse. He is agreeable and agency preference is [facility]. Referral called to agency and accepted for RN and adding SW. Have requested [facility] COPD protocol. Chronic 02 through [facility].

## 2018-04-03 ENCOUNTER — Ambulatory Visit: Payer: Self-pay | Admitting: Internal Medicine

## 2018-04-04 ENCOUNTER — Telehealth: Payer: Self-pay

## 2018-04-04 NOTE — Telephone Encounter (Signed)
Judithann Sauger from [facility] faxed over an Banks that stated. "Due to the inability to locate the patient, the SN start of care has been delayed. Unless we receive notification requesting a different date the [facility] will be the week of 04/06/18."

## 2018-04-09 ENCOUNTER — Ambulatory Visit (INDEPENDENT_AMBULATORY_CARE_PROVIDER_SITE_OTHER): Payer: Medicare Other | Admitting: Family Medicine

## 2018-04-09 ENCOUNTER — Encounter: Payer: Self-pay | Admitting: Family Medicine

## 2018-04-09 VITALS — BP 128/76 | HR 82 | Ht 75.0 in | Wt 120.0 lb

## 2018-04-09 DIAGNOSIS — R339 Retention of urine, unspecified: Secondary | ICD-10-CM | POA: Diagnosis not present

## 2018-04-09 LAB — BLADDER SCAN AMB NON-IMAGING: Scan Result: 121

## 2018-04-09 NOTE — Progress Notes (Signed)
Patient presents today for a PVR to ensure he is no longer in retention. PVR results is .Marland Kitchen He has a knot on the groin area. He does not have any complaints about the knot. He is scheduled to see [facility] to look at the knot. He states the knot comes and goes and does not cause any discomfort. He was scheduled a few weeks ago to see [facility] for the swelling but he states it went away so he cancelled the appointment.

## 2018-04-11 ENCOUNTER — Telehealth: Payer: Self-pay

## 2018-04-11 NOTE — Telephone Encounter (Signed)
[facility] sent a message today in regards to pt   "Due to patients request, the SN start of care has been delayed. Please note that the pt was scheduled for [facility] a few times but continues to delay care. We will attempt to schedule SOC as soon as possible pending availability. Unless we receive a notification requesting a different date the anticipated SOC will be the week of 04/13/18.   From Judithann Sauger [facility] Fax: [phone] and Phone: [phone].

## 2018-04-14 NOTE — Discharge Summary (Signed)
[facility] - [place] at [facility]   PATIENT NAME: Frank Todd Lorentz    MR#:  [id number]  DATE OF BIRTH:  08/19/1951  DATE OF ADMISSION:  03/31/2018 ADMITTING PHYSICIAN: Auburn BilberryShreyang Patel, MD  DATE OF DISCHARGE: 04/02/2018  3:59 PM  PRIMARY CARE PHYSICIAN: Yevonne PaxKhan, Saadat A, MD   ADMISSION DIAGNOSIS:  COPD exacerbation (HCC) [J44.1]  DISCHARGE DIAGNOSIS:  Active Problems:   Acute respiratory failure (HCC)   SECONDARY DIAGNOSIS:   Past Medical History:  Diagnosis Date  . Arthritis    shoulders  . Asthma   . Benign prostatic hypertrophy   . COPD (chronic obstructive pulmonary disease) (HCC)    chronic bronchitis  . GERD (gastroesophageal reflux disease)   . Hypertension   . Seasonal allergies   . Shortness of breath dyspnea   . Wears partial dentures    upper     ADMITTING HISTORY   HISTORY OF PRESENT ILLNESS: Frank Todd Jeansonne  is a 67 y.o. male with a known history of COPD on chronic oxygen therapy with 2-1/2 L of oxygen presenting with worsening shortness of breath since Friday.  Patient states that he ran out of his daily prednisone on Friday and since then has had worsening shortness of breath.  Patient came to the ER had to initially be placed on BiPAP however his breathing is improved and were able to remove him off BiPAP.  Chest x-ray is negative.  He denies any fevers chills has chronic cough.    HOSPITAL COURSE:   *Acute CODP exacerbation Treated with OV steroids, abx and nebs Improved well Back to baeline O2 usage Mild wheezing which is chronic Discharge on steroid taper. prescriotions given for inhalers and home meds  * urinary retnetion Chronic recurrent problem Foley placed F/u with urology as OP  Stable for d/c home with home O2  CONSULTS OBTAINED:    DRUG ALLERGIES:  No Known Allergies  DISCHARGE MEDICATIONS:   Allergies as of 04/02/2018   No Known Allergies     Medication List    TAKE these medications   albuterol 108 (90 Base)  MCG/ACT inhaler Commonly known as:  VENTOLIN HFA Inhale 2 puffs into the lungs every 6 (six) hours as needed for wheezing or shortness of breath.   cetirizine 10 MG tablet Commonly known as:  ZYRTEC Take 10 mg by mouth daily as needed for allergies.   DALIRESP 500 MCG Tabs tablet Generic drug:  roflumilast take 1 tablet by mouth once daily   fluticasone 50 MCG/ACT nasal spray Commonly known as:  FLONASE Place into both nostrils 2 (two) times daily.   Fluticasone-Salmeterol 500-50 MCG/DOSE Aepb Commonly known as:  ADVAIR Inhale 1 puff into the lungs 2 (two) times daily. What changed:  when to take this   meloxicam 15 MG tablet Commonly known as:  MOBIC Take 15 mg by mouth daily.   metoprolol succinate 25 MG 24 hr tablet Commonly known as:  TOPROL-XL Take 25 mg by mouth daily.   omeprazole 20 MG capsule Commonly known as:  PRILOSEC Take 20 mg by mouth daily.   OXYGEN Inhale 2.5 L into the lungs continuous.   predniSONE 10 MG tablet Commonly known as:  DELTASONE Take 1 tablet (10 mg total) by mouth daily. What changed:  Another medication with the same name was added. Make sure you understand how and when to take each.   predniSONE 10 MG (21) Tbpk tablet Commonly known as:  STERAPRED UNI-PAK 21 TAB 6 tabs day 1 and taper  10 mg a day - 6 days What changed:  You were already taking a medication with the same name, and this prescription was added. Make sure you understand how and when to take each.   predniSONE 10 MG tablet Commonly known as:  DELTASONE Take 1 tablet (10 mg total) by mouth daily. What changed:  Another medication with the same name was added. Make sure you understand how and when to take each.   tamsulosin 0.4 MG Caps capsule Commonly known as:  FLOMAX Take 1 capsule (0.4 mg total) by mouth daily after breakfast. AM   tiotropium 18 MCG inhalation capsule Commonly known as:  SPIRIVA HANDIHALER Place 1 capsule (18 mcg total) into inhaler and inhale  daily. What changed:  See the new instructions.       Today   VITAL SIGNS:  Blood pressure 124/80, pulse 95, temperature 98.2 F (36.8 C), temperature source Oral, resp. rate 18, height 6\' 3"  (1.905 m), weight 55.6 kg (122 lb 9.2 oz), SpO2 93 %.  I/O:  No intake or output data in the 24 hours ending 04/14/18 1928  PHYSICAL EXAMINATION:  Physical Exam  GENERAL:  67 y.o.-year-old patient lying in the bed with no acute distress.  LUNGS: Normal breath sounds bilaterally, no wheezing, rales,rhonchi or crepitation. No use of accessory muscles of respiration.  CARDIOVASCULAR: S1, S2 normal. No murmurs, rubs, or gallops.  ABDOMEN: Soft, non-tender, non-distended. Bowel sounds present. No organomegaly or mass.  NEUROLOGIC: Moves all 4 extremities. PSYCHIATRIC: The patient is alert and oriented x 3.  SKIN: No obvious rash, lesion, or ulcer.   DATA REVIEW:   CBC No results for input(s): WBC, HGB, HCT, PLT in the last 168 hours.  Chemistries  No results for input(s): NA, K, CL, CO2, GLUCOSE, BUN, CREATININE, CALCIUM, MG, AST, ALT, ALKPHOS, BILITOT in the last 168 hours.  Invalid input(s): GFRCGP  Cardiac Enzymes No results for input(s): TROPONINI in the last 168 hours.  Microbiology Results  Results for orders placed or performed in visit on 01/15/18  Microscopic Examination     Status: Abnormal   Collection Time: 01/15/18  3:33 PM  Result Value Ref Range Status   WBC, UA 6-10 (A) 0 - 5 /hpf Final   RBC, UA 0-2 0 - 2 /hpf Final   Epithelial Cells (non renal) 0-10 0 - 10 /hpf Final   Mucus, UA Present (A) Not Estab. Final   Bacteria, UA Many (A) None seen/Few Final    RADIOLOGY:  No results found.  Follow up with PCP in 1 week.  Management plans discussed with the patient, family and they are in agreement.  CODE STATUS:  Code Status History    Date Active Date Inactive Code Status Order ID Comments User Context   03/31/2018 1301 04/02/2018 1904 Full Code [id number]   Auburn Bilberry, MD Inpatient      TOTAL TIME TAKING CARE OF THIS PATIENT ON DAY OF DISCHARGE: more than 30 minutes.   Molinda Bailiff Pascuala Klutts M.D on 04/14/2018 at 7:28 PM  Between 7am to 6pm - Pager - [phone]  After 6pm go to [email or web address] - password [email or web address] [facility]  [facility] [place] [facility]  Office  [phone]  CC: Primary care physician; Yevonne Pax, MD  Note: This dictation was prepared with Dragon dictation along with smaller phrase technology. Any transcriptional errors that result from this process are unintentional.

## 2018-04-15 ENCOUNTER — Telehealth: Payer: Self-pay | Admitting: Internal Medicine

## 2018-04-15 NOTE — Telephone Encounter (Signed)
Amy Pope from [facility] called stating pt was referred to [facility] after being admitted in hospital for 1 week. Requested  Care has not been admitted.

## 2018-05-21 ENCOUNTER — Encounter: Payer: Self-pay | Admitting: Urology

## 2018-05-21 ENCOUNTER — Ambulatory Visit (INDEPENDENT_AMBULATORY_CARE_PROVIDER_SITE_OTHER): Payer: Medicare Other | Admitting: Urology

## 2018-05-21 VITALS — BP 129/84 | HR 79 | Ht 75.0 in | Wt 130.2 lb

## 2018-05-21 DIAGNOSIS — R3914 Feeling of incomplete bladder emptying: Secondary | ICD-10-CM | POA: Diagnosis not present

## 2018-05-21 DIAGNOSIS — K4090 Unilateral inguinal hernia, without obstruction or gangrene, not specified as recurrent: Secondary | ICD-10-CM | POA: Diagnosis not present

## 2018-05-21 DIAGNOSIS — N401 Enlarged prostate with lower urinary tract symptoms: Secondary | ICD-10-CM | POA: Diagnosis not present

## 2018-05-23 ENCOUNTER — Encounter: Payer: Self-pay | Admitting: Urology

## 2018-05-23 NOTE — Progress Notes (Signed)
05/21/2018 12:26 PM   Frank Recordslaude H Santelli Jr. 02/13/1951 [id number]  Referring provider: Yevonne PaxKhan, Saadat A, MD [address] [place], [place] [id number]  Chief Complaint  Patient presents with  . Follow-up    HPI: 67 year old male I have followed for BPH and microhematuria.  He has refused cystoscopy.  He was [facility] to [facility] on 03/31/2018 with acute respiratory failure/COPD exacerbation.  He developed urinary retention during that hospitalization and had a Foley catheter placed.  He was seen here on 04/09/2018 for a bladder scan for PVR which was 121 mL.  He feels his voiding pattern is back to baseline.  He states after his catheter was placed he developed a bulge in his right groin area that he thinks is related to the catheter placement.  The bulge is intermittent.  He denies gross hematuria.  He remains on tamsulosin.   PMH: Past Medical History:  Diagnosis Date  . Arthritis    shoulders  . Asthma   . Benign prostatic hypertrophy   . COPD (chronic obstructive pulmonary disease) (HCC)    chronic bronchitis  . GERD (gastroesophageal reflux disease)   . Hypertension   . Seasonal allergies   . Shortness of breath dyspnea   . Wears partial dentures    upper    Surgical History: Past Surgical History:  Procedure Laterality Date  . CATARACT EXTRACTION W/PHACO Right 03/30/2015   Procedure: CATARACT EXTRACTION PHACO AND INTRAOCULAR LENS PLACEMENT (IOC);  Surgeon: Lockie Molahadwick Brasington, MD;  [facility];  Service: Ophthalmology;  Laterality: Right;  . CIRCUMCISION     as child  . DENTAL SURGERY      Home Medications:  Allergies as of 05/21/2018   No Known Allergies     Medication List        Accurate as of 05/21/18 11:59 PM. Always use your most recent med list.          albuterol 108 (90 Base) MCG/ACT inhaler Commonly known as:  VENTOLIN HFA Inhale 2 puffs into the lungs every 6 (six) hours as needed for wheezing or shortness of breath.   amLODipine 5 MG  tablet Commonly known as:  NORVASC Take 5 mg by mouth daily. for high blood pressure   cetirizine 10 MG tablet Commonly known as:  ZYRTEC Take 10 mg by mouth daily as needed for allergies.   DALIRESP 500 MCG Tabs tablet Generic drug:  roflumilast take 1 tablet by mouth once daily   fluticasone 50 MCG/ACT nasal spray Commonly known as:  FLONASE Place into both nostrils 2 (two) times daily.   Fluticasone-Salmeterol 500-50 MCG/DOSE Aepb Commonly known as:  ADVAIR Inhale 1 puff into the lungs 2 (two) times daily.   meloxicam 15 MG tablet Commonly known as:  MOBIC Take 15 mg by mouth daily.   metoprolol succinate 25 MG 24 hr tablet Commonly known as:  TOPROL-XL Take 25 mg by mouth daily.   omeprazole 20 MG capsule Commonly known as:  PRILOSEC Take 20 mg by mouth daily.   OXYGEN Inhale 2.5 L into the lungs continuous.   predniSONE 10 MG tablet Commonly known as:  DELTASONE Take 1 tablet (10 mg total) by mouth daily.   predniSONE 10 MG (21) Tbpk tablet Commonly known as:  STERAPRED UNI-PAK 21 TAB 6 tabs day 1 and taper 10 mg a day - 6 days   predniSONE 10 MG tablet Commonly known as:  DELTASONE Take 1 tablet (10 mg total) by mouth daily.   tamsulosin 0.4 MG  Caps capsule Commonly known as:  FLOMAX Take 1 capsule (0.4 mg total) by mouth daily after breakfast. AM   tiotropium 18 MCG inhalation capsule Commonly known as:  SPIRIVA HANDIHALER Place 1 capsule (18 mcg total) into inhaler and inhale daily.       Allergies: No Known Allergies  Family History: History reviewed. No pertinent family history.  Social History:  reports that he quit smoking about 33 years ago. He has never used smokeless tobacco. He reports that he drinks about 1.8 oz of alcohol per week. He reports that he does not use drugs.  ROS: UROLOGY Frequent Urination?: No Hard to postpone urination?: No Burning/pain with urination?: No Get up at night to urinate?: No Leakage of urine?:  No Urine stream starts and stops?: No Trouble starting stream?: No Do you have to strain to urinate?: No Blood in urine?: No Urinary tract infection?: No Sexually transmitted disease?: No Injury to kidneys or bladder?: No Painful intercourse?: No Weak stream?: No Erection problems?: No Penile pain?: No  Gastrointestinal Nausea?: No Vomiting?: No Indigestion/heartburn?: Yes Diarrhea?: Yes Constipation?: Yes  Constitutional Fever: No Night sweats?: No Weight loss?: Yes Fatigue?: Yes  Skin Skin rash/lesions?: Yes Itching?: Yes  Eyes Blurred vision?: Yes Double vision?: No  Ears/Nose/Throat Sore throat?: No Sinus problems?: Yes  Hematologic/Lymphatic Swollen glands?: No Easy bruising?: No  Cardiovascular Leg swelling?: Yes Chest pain?: No  Respiratory Cough?: No Shortness of breath?: Yes  Endocrine Excessive thirst?: Yes  Musculoskeletal Back pain?: Yes Joint pain?: Yes  Neurological Headaches?: No Dizziness?: No  Psychologic Depression?: No Anxiety?: Yes  Physical Exam: BP 129/84 (BP Location: Left Arm, Patient Position: Sitting, Cuff Size: Normal)   Pulse 79   Ht 6\' 3"  (1.905 m)   Wt 130 lb 3.2 oz (59.1 kg)   BMI 16.27 kg/m    Constitutional:  Alert and oriented, No acute distress. HEENT: [place] AT, moist mucus membranes.  Trachea midline, no masses. Cardiovascular: No clubbing, cyanosis, or edema. Respiratory: Normal respiratory effort, no increased work of breathing. GI: Abdomen is soft, nontender, nondistended, no abdominal masses GU: No CVA tenderness.  Right groin bulge consistent with inguinal hernia. Lymph: No cervical or inguinal lymphadenopathy. Skin: No rashes, bruises or suspicious lesions. Neurologic: Grossly intact, no focal deficits, moving all 4 extremities. Psychiatric: Normal mood and affect.  Laboratory Data: Lab Results  Component Value Date   WBC 7.4 04/01/2018   HGB 12.0 (L) 04/01/2018   HCT 37.9 (L) 04/01/2018    MCV 87.0 04/01/2018   PLT 199 04/01/2018    Lab Results  Component Value Date   CREATININE 1.21 04/01/2018     Assessment & Plan:   67 year old male with a right inguinal hernia.  He does state when he compresses the area that he will urinate more.  Unlikely this is bladder herniation however will obtain a pelvic ultrasound.  He will be notified with results and further recommendations.    Riki Altes, MD  [facility] [address] [place], [place] [phone]

## 2018-05-29 ENCOUNTER — Other Ambulatory Visit: Payer: Self-pay | Admitting: Internal Medicine

## 2018-06-03 ENCOUNTER — Other Ambulatory Visit: Payer: Self-pay | Admitting: Internal Medicine

## 2018-06-06 ENCOUNTER — Encounter: Payer: Self-pay | Admitting: Emergency Medicine

## 2018-06-06 ENCOUNTER — Emergency Department
Admission: EM | Admit: 2018-06-06 | Discharge: 2018-06-06 | Disposition: A | Discharge status: Home / Self Care | Payer: Medicare Other | Attending: Emergency Medicine | Admitting: Emergency Medicine

## 2018-06-06 DIAGNOSIS — R339 Retention of urine, unspecified: Secondary | ICD-10-CM | POA: Insufficient documentation

## 2018-06-06 DIAGNOSIS — R338 Other retention of urine: Secondary | ICD-10-CM

## 2018-06-06 DIAGNOSIS — Z79899 Other long term (current) drug therapy: Secondary | ICD-10-CM | POA: Insufficient documentation

## 2018-06-06 DIAGNOSIS — N401 Enlarged prostate with lower urinary tract symptoms: Secondary | ICD-10-CM | POA: Insufficient documentation

## 2018-06-06 DIAGNOSIS — Z87891 Personal history of nicotine dependence: Secondary | ICD-10-CM | POA: Diagnosis not present

## 2018-06-06 DIAGNOSIS — J449 Chronic obstructive pulmonary disease, unspecified: Secondary | ICD-10-CM | POA: Diagnosis not present

## 2018-06-06 DIAGNOSIS — I10 Essential (primary) hypertension: Secondary | ICD-10-CM | POA: Insufficient documentation

## 2018-06-06 LAB — URINALYSIS, COMPLETE (UACMP) WITH MICROSCOPIC
Bacteria, UA: NONE SEEN
Bilirubin Urine: NEGATIVE
GLUCOSE, UA: NEGATIVE mg/dL
Hgb urine dipstick: NEGATIVE
KETONES UR: NEGATIVE mg/dL
Leukocytes, UA: NEGATIVE
Nitrite: NEGATIVE
PH: 6 (ref 5.0–8.0)
Protein, ur: NEGATIVE mg/dL
Specific Gravity, Urine: 1.005 (ref 1.005–1.030)
Squamous Epithelial / LPF: NONE SEEN (ref 0–5)

## 2018-06-06 NOTE — Discharge Instructions (Signed)
You will have a urinary catheter in place until you follow-up with your urologist. Increase your Flomax to 2 pills daily, until you see urology. Return to the Emergency Department, immediately for any gross (obvious) blood or increased pain.

## 2018-06-06 NOTE — ED Provider Notes (Signed)
[facility] Emergency Department Provider Note ____________________________________________  Time seen: 52  I have reviewed the triage vital signs and the nursing notes.  HISTORY  Chief Complaint  Urinary Retention  HPI Frank Todd. is a 67 y.o. male presents to the ED for ration of a 3-day complaint of difficulty passing urine.  Patient has benign prostatic hypertrophy and intermittent symptoms of urinary retention.  He has had to come in for catheter placement several times in the past.  He denies any gross hematuria or dysuria.  Also denies any fevers, chills, sweats.  Patient denies any abdominal pain or penile discomfort.  Reports intermittent swelling to the penis following a recent catheter placement.  He notes that this swelling is not painful at all.  He was last seen by Dr. Lonna Cobb on July 10, for evaluation of right inguinal hernia and a post void residual test.  Patient reported baseline symptoms and has had no interim symptoms until 3 days prior.  Past Medical History:  Diagnosis Date  . Arthritis    shoulders  . Asthma   . Benign prostatic hypertrophy   . COPD (chronic obstructive pulmonary disease) (HCC)    chronic bronchitis  . GERD (gastroesophageal reflux disease)   . Hypertension   . Seasonal allergies   . Shortness of breath dyspnea   . Wears partial dentures    upper    Patient Active Problem List   Diagnosis Date Noted  . Acute respiratory failure (HCC) 03/31/2018  . Microscopic hematuria 12/19/2017  . Pneumonia 11/26/2017  . Chronic respiratory failure with hypoxia (HCC) 11/26/2017  . Candidal stomatitis 11/26/2017  . Atelectasis 11/26/2017  . Solitary pulmonary nodule 11/26/2017  . Panlobular emphysema (HCC) 11/26/2017  . Chronic obstructive pulmonary disease with acute exacerbation (HCC) 11/26/2017  . 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 Hemoptysis 11/26/2017  . Shortness of breath 11/26/2017  .  Hypertension, essential 11/26/2017  . Benign prostatic hyperplasia with lower urinary tract symptoms 03/12/2014    Past Surgical History:  Procedure Laterality Date  . CATARACT EXTRACTION W/PHACO Right 03/30/2015   Procedure: CATARACT EXTRACTION PHACO AND INTRAOCULAR LENS PLACEMENT (IOC);  Surgeon: Lockie Mola, MD;  Location: [facility];  Service: Ophthalmology;  Laterality: Right;  . CIRCUMCISION     as child  . DENTAL SURGERY      Prior to Admission medications   Medication Sig Start Date End Date Taking? Authorizing Provider  ADVAIR DISKUS 500-50 MCG/DOSE AEPB inhale 1 dose by mouth twice a day 06/03/18   Yevonne Pax, MD  albuterol (VENTOLIN HFA) 108 (90 Base) MCG/ACT inhaler Inhale 2 puffs into the lungs every 6 (six) hours as needed for wheezing or shortness of breath. 04/01/18   Milagros Loll, MD  ALL DAY ALLERGY 10 MG tablet take 1 tablet by mouth once daily 05/29/18   Yevonne Pax, MD  amLODipine (NORVASC) 5 MG tablet Take 5 mg by mouth daily. for high blood pressure 04/02/18   [provider]  cetirizine (ZYRTEC) 10 MG tablet Take 10 mg by mouth daily as needed for allergies.     [provider]  DALIRESP 500 MCG TABS tablet take 1 tablet by mouth once daily 06/04/18   Yevonne Pax, MD  fluticasone [facility]) 50 MCG/ACT nasal spray Place into both nostrils 2 (two) times daily.    [provider]  meloxicam (MOBIC) 15 MG tablet Take 15 mg by mouth daily. 03/09/18   [provider]  metoprolol succinate (TOPROL-XL) 25 MG 24 hr tablet Take 25 mg by mouth daily. 02/19/18   [provider]  omeprazole (PRILOSEC) 20 MG capsule Take 20 mg by mouth daily.     [provider]  OXYGEN Inhale 2.5 L into the lungs continuous.    [provider]  predniSONE (DELTASONE) 10 MG tablet Take 1 tablet (10 mg total) by mouth daily. 02/14/18   Yevonne Pax, MD  predniSONE (DELTASONE) 10 MG tablet Take 1 tablet (10 mg total)  by mouth daily. 04/07/18   Milagros Loll, MD  predniSONE (STERAPRED UNI-PAK 21 TAB) 10 MG (21) TBPK tablet 6 tabs day 1 and taper 10 mg a day - 6 days 04/01/18   Milagros Loll, MD  tamsulosin (FLOMAX) 0.4 MG CAPS capsule Take 1 capsule (0.4 mg total) by mouth daily after breakfast. AM 12/18/17   Stoioff, Verna Czech, MD  tiotropium (SPIRIVA HANDIHALER) 18 MCG inhalation capsule Place 1 capsule (18 mcg total) into inhaler and inhale daily. 04/01/18   Milagros Loll, MD    Allergies Patient has no known allergies.  No family history on file.  Social History Social History   Tobacco Use  . Smoking status: Former Smoker    Last attempt to quit: 11/12/1984    Years since quitting: 33.5  . Smokeless tobacco: Never Used  Substance Use Topics  . Alcohol use: Yes    Alcohol/week: 1.8 oz    Types: 3 Shots of liquor per week    Comment: pt said "several pints per week"  . Drug use: No    Review of Systems  Constitutional: Negative for fever. Cardiovascular: Negative for chest pain. Respiratory: Negative for shortness of breath. Gastrointestinal: Negative for abdominal pain, vomiting and diarrhea. Genitourinary: Negative for dysuria.  Urinary retention as above. Musculoskeletal: Negative for back pain. Skin: Negative for rash. Neurological: Negative for headaches, focal weakness or numbness. ____________________________________________  PHYSICAL EXAM:  VITAL SIGNS: ED Triage Vitals  Enc Vitals Group     BP 06/06/18 1028 128/68     Pulse Rate 06/06/18 1028 70     Resp 06/06/18 1028 19     Temp 06/06/18 1028 98.1 F (36.7 C)     Temp Source 06/06/18 1028 Oral     SpO2 06/06/18 1028 98 %     Weight 06/06/18 1029 130 lb (59 kg)     Height 06/06/18 1029 6' 4.5" (1.943 m)     Head Circumference --      Peak Flow --      Pain Score 06/06/18 1029 8     Pain Loc --      Pain Edu? --      Excl. in GC? --     Constitutional: Alert and oriented. Well appearing and in no distress. Head:  Normocephalic and atraumatic. Eyes: Conjunctivae are normal. Normal extraocular movements Cardiovascular: Normal rate, regular rhythm. Normal distal pulses. Respiratory: Normal respiratory effort. No wheezes/rales/rhonchi. Gastrointestinal: Soft and nontender. No distention. Musculoskeletal: Nontender with normal range of motion in all extremities.  Skin:  Skin is warm, dry and intact. No rash noted. Psychiatric: Mood and affect are normal. Patient exhibits appropriate insight and judgment. ____________________________________________   LABS (pertinent positives/negatives) Labs Reviewed  URINALYSIS, COMPLETE (UACMP) WITH MICROSCOPIC - Abnormal; Notable for the following components:      Result Value   Color, Urine YELLOW (*)    APPearance CLEAR (*)    All other components within normal limits  ____________________________________________  PROCEDURES  Bladder  Scan - 771 ml Foley catheter placed ____________________________________________  INITIAL IMPRESSION / ASSESSMENT AND PLAN / ED COURSE  Patient with ED evaluation of urinary retention. Symptoms have been intermittent according to his chart. He is reassured by his negative UA. He will discharge with the foley, but antibiotics are not indicated. He will follow-up with Dr. Lonna CobbStoioff next week for re-evaluation. He will increase his Flomax to 0.8 mg daily.  ____________________________________________  FINAL CLINICAL IMPRESSION(S) / ED DIAGNOSES  Final diagnoses:  Urinary retention  Benign prostatic hyperplasia with urinary retention      Lissa HoardMenshew, Loukisha Gunnerson V Bacon, PA-C 06/06/18 1608    Minna AntisPaduchowski, Kevin, MD 06/17/18 2204

## 2018-06-06 NOTE — ED Notes (Signed)
Leg bag applied. Instructions reviewed. Patient has had home cath before and states is familiar with care.

## 2018-06-06 NOTE — ED Notes (Signed)
Per [name], okay to insert foley catheter and send to flex.

## 2018-06-06 NOTE — ED Triage Notes (Addendum)
Pt arrives with complaints of urinary retention. Pt states he has had struggling with urination for 3 days. Pt has recurrent problems with retention and has had to come in to have a urinary catheter placed "multiple times" per pt report. Pt very uncomfortable in triage. Bladder scan performed, findings of 

## 2018-06-16 ENCOUNTER — Telehealth: Payer: Self-pay | Admitting: Urology

## 2018-06-16 ENCOUNTER — Ambulatory Visit: Payer: Medicare Other

## 2018-06-16 ENCOUNTER — Other Ambulatory Visit: Payer: Self-pay

## 2018-06-16 VITALS — BP 127/69 | HR 67 | Ht 75.0 in | Wt 130.0 lb

## 2018-06-16 DIAGNOSIS — R339 Retention of urine, unspecified: Secondary | ICD-10-CM

## 2018-06-16 NOTE — Progress Notes (Signed)
Frank MossClaude Todd is present today for a nurse visit. He was evaluated in the emergency room for urinary retention on 06/06/2018. He had a foley catheter placed while in the ED and was instructed to follow-up with urology. Pt will follow-up with Dr. Lonna CobbStoioff this week.  Catheter Removal  Patient is present today for a catheter removal.  10ml of water was drained from the balloon. A 16FR foley cath was removed from the bladder no complications were noted . Patient tolerated well.  Preformed by: Nydia Boutonhristopher Thompson, CMA  Follow up/ Additional notes: Follow-up with Dr. Lonna CobbStoioff or [name] this week.

## 2018-06-16 NOTE — Telephone Encounter (Signed)
Pt called office to let clinical know that he was in office earlier this morning and was advised to let office know if he was able to urinate on his own by the early afternoon, pt states he is urinating just fine right now and thanks staff for their help.  FYI

## 2018-06-17 ENCOUNTER — Telehealth: Payer: Self-pay | Admitting: Urology

## 2018-06-17 NOTE — Telephone Encounter (Signed)
Left message for pt to call office

## 2018-06-17 NOTE — Telephone Encounter (Signed)
Pt returned your call and would like for you to call him back at your convenience.

## 2018-06-17 NOTE — Telephone Encounter (Signed)
Patient cx his app for 06-19-18 due to lack of transportation and he wants to know why he needs to come back. He would like for you to call him to discuss.  Marcelino DusterMichelle

## 2018-06-17 NOTE — Telephone Encounter (Signed)
Pt returned your call and would like for you to call him back.  

## 2018-06-17 NOTE — Telephone Encounter (Signed)
Spoke with pt. He wanted to let me know he didn't need his appointment.

## 2018-06-18 ENCOUNTER — Other Ambulatory Visit: Payer: Self-pay

## 2018-06-18 ENCOUNTER — Emergency Department
Admission: EM | Admit: 2018-06-18 | Discharge: 2018-06-18 | Disposition: A | Discharge status: Home / Self Care | Payer: Medicare Other | Attending: Emergency Medicine | Admitting: Emergency Medicine

## 2018-06-18 ENCOUNTER — Encounter: Payer: Self-pay | Admitting: Emergency Medicine

## 2018-06-18 DIAGNOSIS — E860 Dehydration: Secondary | ICD-10-CM

## 2018-06-18 DIAGNOSIS — Z87891 Personal history of nicotine dependence: Secondary | ICD-10-CM | POA: Insufficient documentation

## 2018-06-18 DIAGNOSIS — Z79899 Other long term (current) drug therapy: Secondary | ICD-10-CM | POA: Diagnosis not present

## 2018-06-18 DIAGNOSIS — I10 Essential (primary) hypertension: Secondary | ICD-10-CM | POA: Diagnosis not present

## 2018-06-18 DIAGNOSIS — N3090 Cystitis, unspecified without hematuria: Secondary | ICD-10-CM | POA: Insufficient documentation

## 2018-06-18 DIAGNOSIS — R339 Retention of urine, unspecified: Secondary | ICD-10-CM | POA: Diagnosis not present

## 2018-06-18 DIAGNOSIS — J449 Chronic obstructive pulmonary disease, unspecified: Secondary | ICD-10-CM | POA: Diagnosis not present

## 2018-06-18 LAB — URINALYSIS, COMPLETE (UACMP) WITH MICROSCOPIC
Bilirubin Urine: NEGATIVE
Glucose, UA: NEGATIVE mg/dL
KETONES UR: NEGATIVE mg/dL
Nitrite: NEGATIVE
Protein, ur: NEGATIVE mg/dL
Specific Gravity, Urine: 1.004 — ABNORMAL LOW (ref 1.005–1.030)
pH: 8 (ref 5.0–8.0)

## 2018-06-18 LAB — BASIC METABOLIC PANEL
ANION GAP: 15 (ref 5–15)
BUN: 16 mg/dL (ref 8–23)
CALCIUM: 8.9 mg/dL (ref 8.9–10.3)
CO2: 24 mmol/L (ref 22–32)
Chloride: 96 mmol/L — ABNORMAL LOW (ref 98–111)
Creatinine, Ser: 1.28 mg/dL — ABNORMAL HIGH (ref 0.61–1.24)
GFR calc non Af Amer: 56 mL/min — ABNORMAL LOW (ref >60)
GLUCOSE: 70 mg/dL (ref 70–99)
Potassium: 3.4 mmol/L — ABNORMAL LOW (ref 3.5–5.1)
Sodium: 135 mmol/L (ref 135–145)

## 2018-06-18 LAB — CBC WITH DIFFERENTIAL/PLATELET
BASOS ABS: 0.1 10*3/uL (ref 0–0.1)
Basophils Relative: 1 %
EOS ABS: 0.1 10*3/uL (ref 0–0.7)
Eosinophils Relative: 1 %
HEMATOCRIT: 39.7 % — AB (ref 40.0–52.0)
Hemoglobin: 12.9 g/dL — ABNORMAL LOW (ref 13.0–18.0)
Lymphocytes Relative: 17 %
Lymphs Abs: 1.7 10*3/uL (ref 1.0–3.6)
MCH: 27.7 pg (ref 26.0–34.0)
MCHC: 32.5 g/dL (ref 32.0–36.0)
MCV: 85.3 fL (ref 80.0–100.0)
MONO ABS: 1 10*3/uL (ref 0.2–1.0)
Monocytes Relative: 10 %
NEUTROS ABS: 7.2 10*3/uL — AB (ref 1.4–6.5)
NEUTROS PCT: 71 %
Platelets: 232 10*3/uL (ref 150–440)
RBC: 4.65 MIL/uL (ref 4.40–5.90)
RDW: 14.9 % — AB (ref 11.5–14.5)
WBC: 10.1 10*3/uL (ref 3.8–10.6)

## 2018-06-18 MED ORDER — SODIUM CHLORIDE 0.9 % IV BOLUS
1000.0000 mL | Freq: Once | INTRAVENOUS | Status: AC
  Administered 2018-06-18: 1000 mL via INTRAVENOUS

## 2018-06-18 MED ORDER — CEPHALEXIN 500 MG PO CAPS: 500.0000 mg | ORAL_CAPSULE | Freq: Two times a day (BID) | ORAL | 0 refills | Status: AC

## 2018-06-18 MED ORDER — CEFTRIAXONE SODIUM 1 G IJ SOLR
1.0000 g | Freq: Once | INTRAMUSCULAR | Status: AC
  Administered 2018-06-18: 1 g via INTRAVENOUS
  Filled 2018-06-18: qty 10

## 2018-06-18 NOTE — Discharge Instructions (Signed)
Take the antibiotic as prescribed and finish the full course.  Follow-up with the urologist in 1 week to be rechecked and to have the catheter evaluated for removal.  Return to the ER for new, worsening, or persistent retention, or any fevers, vomiting, weakness, or any other new or worsening symptoms that concern you.

## 2018-06-18 NOTE — ED Triage Notes (Signed)
Pt comes into the ED via [facility] from home where is his sister called for EMS due to the patient having urinary retention.  Patient seen at his MD yesterday where they did an in and out catheter with grey urine output.  They instructed him that if it didn't get any better today then he would need to be seen here.  Patient has ETOH on board with minimal water intake.  Last void was this morning with only a small amount of output.

## 2018-06-18 NOTE — ED Provider Notes (Signed)
[facility] Emergency Department Provider Note ____________________________________________   First MD Initiated Contact with Patient 06/18/18 1137     (approximate)  I have reviewed the triage vital signs and the nursing notes.   HISTORY  Chief Complaint Urinary Retention    HPI Frank Todd. is a 67 y.o. male AMH as noted below who presents with urinary retention over the last day, gradual onset, and associated with pressure over his bladder.  The patient has had problems with urinary retention and has had catheters before.  He was seen in the ED on 06/06/2018 and a Foley was placed.  He states that it was removed yesterday.  The patient denies any abdominal pain, fever, vomiting, or other acute symptoms.  Past Medical History:  Diagnosis Date  . Arthritis    shoulders  . Asthma   . Benign prostatic hypertrophy   . COPD (chronic obstructive pulmonary disease) (HCC)    chronic bronchitis  . GERD (gastroesophageal reflux disease)   . Hypertension   . Seasonal allergies   . Shortness of breath dyspnea   . Wears partial dentures    upper    Patient Active Problem List   Diagnosis Date Noted  . Acute respiratory failure (HCC) 03/31/2018  . Microscopic hematuria 12/19/2017  . Pneumonia 11/26/2017  . Chronic respiratory failure with hypoxia (HCC) 11/26/2017  . Candidal stomatitis 11/26/2017  . Atelectasis 11/26/2017  . Solitary pulmonary nodule 11/26/2017  . Panlobular emphysema (HCC) 11/26/2017  . Chronic obstructive pulmonary disease with acute exacerbation (HCC) 11/26/2017  . 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 Hemoptysis 11/26/2017  . Shortness of breath 11/26/2017  . Hypertension, essential 11/26/2017  . Benign prostatic hyperplasia with lower urinary tract symptoms 03/12/2014    Past Surgical History:  Procedure Laterality Date  . CATARACT EXTRACTION W/PHACO Right 03/30/2015   Procedure: CATARACT EXTRACTION  PHACO AND INTRAOCULAR LENS PLACEMENT (IOC);  Surgeon: Lockie Mola, MD;  Location: [facility];  Service: Ophthalmology;  Laterality: Right;  . CIRCUMCISION     as child  . DENTAL SURGERY      Prior to Admission medications   Medication Sig Start Date End Date Taking? Authorizing Provider  ADVAIR DISKUS 500-50 MCG/DOSE AEPB inhale 1 dose by mouth twice a day 06/03/18  Yes Yevonne Pax, MD  albuterol (VENTOLIN HFA) 108 (90 Base) MCG/ACT inhaler Inhale 2 puffs into the lungs every 6 (six) hours as needed for wheezing or shortness of breath. 04/01/18  Yes Milagros Loll, MD  ALL DAY ALLERGY 10 MG tablet take 1 tablet by mouth once daily 05/29/18  Yes Freda Munro A, MD  amLODipine (NORVASC) 5 MG tablet Take 5 mg by mouth daily. for high blood pressure 04/02/18  Yes [provider]  cetirizine (ZYRTEC) 10 MG tablet Take 10 mg by mouth daily as needed for allergies.    Yes [provider]  DALIRESP 500 MCG TABS tablet take 1 tablet by mouth once daily 06/04/18  Yes Yevonne Pax, MD  fluticasone [facility]) 50 MCG/ACT nasal spray Place into both nostrils 2 (two) times daily.   Yes [provider]  meloxicam (MOBIC) 15 MG tablet Take 15 mg by mouth daily. 03/09/18  Yes [provider]  metoprolol succinate (TOPROL-XL) 25 MG 24 hr tablet Take 25 mg by mouth daily. 02/19/18  Yes [provider]  omeprazole (PRILOSEC) 20 MG capsule Take 20 mg by mouth daily.    Yes [provider]  OXYGEN  Inhale 2.5 L into the lungs continuous.   Yes [provider]  predniSONE (DELTASONE) 10 MG tablet Take 1 tablet (10 mg total) by mouth daily. 02/14/18  Yes Yevonne PaxKhan, Saadat A, MD  tamsulosin (FLOMAX) 0.4 MG CAPS capsule Take 1 capsule (0.4 mg total) by mouth daily after breakfast. AM 12/18/17  Yes Stoioff, Verna CzechScott C, MD  tiotropium (SPIRIVA HANDIHALER) 18 MCG inhalation capsule Place 1 capsule (18 mcg total) into inhaler and inhale daily. 04/01/18  Yes  Sudini, Wardell HeathSrikar, MD  cephALEXin (KEFLEX) 500 MG capsule Take 1 capsule (500 mg total) by mouth 2 (two) times daily for 10 days. 06/18/18 06/28/18  Dionne BucySiadecki, Dewain Platz, MD  predniSONE (DELTASONE) 10 MG tablet Take 1 tablet (10 mg total) by mouth daily. Patient not taking: Reported on 06/18/2018 04/07/18   Milagros LollSudini, Srikar, MD  predniSONE (STERAPRED UNI-PAK 21 TAB) 10 MG (21) TBPK tablet 6 tabs day 1 and taper 10 mg a day - 6 days Patient not taking: Reported on 06/18/2018 04/01/18   Milagros LollSudini, Srikar, MD    Allergies Patient has no known allergies.  No family history on file.  Social History Social History   Tobacco Use  . Smoking status: Former Smoker    Last attempt to quit: 11/12/1984    Years since quitting: 33.6  . Smokeless tobacco: Never Used  Substance Use Topics  . Alcohol use: Yes    Alcohol/week: 1.8 oz    Types: 3 Shots of liquor per week    Comment: pt said "several pints per week"  . Drug use: No    Review of Systems  Constitutional: No fever. Eyes: No redness. ENT: No neck pain. Cardiovascular: Denies chest pain. Respiratory: Denies acute shortness of breath. Gastrointestinal: No vomiting.  Genitourinary: Positive for urinary retention.  Musculoskeletal: Negative for back pain. Skin: Negative for rash. Neurological: Negative for headache.   ____________________________________________   PHYSICAL EXAM:  VITAL SIGNS: ED Triage Vitals  Enc Vitals Group     BP 06/18/18 1131 (!) 122/96     Pulse Rate 06/18/18 1131 (!) 114     Resp 06/18/18 1131 16     Temp 06/18/18 1132 98.2 F (36.8 C)     Temp Source 06/18/18 1131 Oral     SpO2 06/18/18 1131 98 %     Weight 06/18/18 1132 130 lb (59 kg)     Height 06/18/18 1132 6\' 3"  (1.905 m)     Head Circumference --      Peak Flow --      Pain Score 06/18/18 1132 0     Pain Loc --      Pain Edu? --      Excl. in GC? --     Constitutional: Alert and oriented. Well appearing and in no acute distress. Eyes: Conjunctivae  are normal.  Head: Atraumatic. Nose: No congestion/rhinnorhea. Mouth/Throat: Mucous membranes are moist.   Neck: Normal range of motion.  Cardiovascular: Good peripheral circulation. Respiratory: Normal respiratory effort.  Gastrointestinal: Soft and nontender. No distention.  Genitourinary: Normal external genitalia.  3 cm right inguinal hernia easily reducible and nontender. Musculoskeletal: Extremities warm and well perfused.  Neurologic:  Normal speech and language. No gross focal neurologic deficits are appreciated.  Skin:  Skin is warm and dry. No rash noted. Psychiatric: Mood and affect are normal. Speech and behavior are normal.  ____________________________________________   LABS (all labs ordered are listed, but only abnormal results are displayed)  Labs Reviewed  URINALYSIS, COMPLETE (UACMP) WITH MICROSCOPIC - Abnormal;  Notable for the following components:      Result Value   Color, Urine YELLOW (*)    APPearance CLOUDY (*)    Specific Gravity, Urine 1.004 (*)    Hgb urine dipstick MODERATE (*)    Leukocytes, UA LARGE (*)    WBC, UA >50 (*)    Bacteria, UA MANY (*)    All other components within normal limits  BASIC METABOLIC PANEL - Abnormal; Notable for the following components:   Potassium 3.4 (*)    Chloride 96 (*)    Creatinine, Ser 1.28 (*)    GFR calc non Af Amer 56 (*)    All other components within normal limits  CBC WITH DIFFERENTIAL/PLATELET - Abnormal; Notable for the following components:   Hemoglobin 12.9 (*)    HCT 39.7 (*)    RDW 14.9 (*)    Neutro Abs 7.2 (*)    All other components within normal limits   ____________________________________________  EKG   ____________________________________________  RADIOLOGY    ____________________________________________   PROCEDURES  Procedure(s) performed: No  Procedures  Critical Care performed: No ____________________________________________   INITIAL IMPRESSION / ASSESSMENT AND  PLAN / ED COURSE  Pertinent labs & imaging results that were available during my care of the patient were reviewed by me and considered in my medical decision making (see chart for details).  67 year old male with PMH as noted above presents with recurrent urinary retention after having Foley catheter removed yesterday.  The patient denies other significant symptoms.  I reviewed the past medical records in Epic and confirmed that the patient was seen late last month and had a Foley placed.  It was removed yesterday.  He has had no recent urine cultures in the last few months.  On exam, the patient is well-appearing.  His vital signs are normal except for tachycardia which is most likely due to his discomfort as well as alcohol use.  We will place a Foley, obtain UA and basic labs, and reassess.  ----------------------------------------- 3:18 PM on 06/18/2018 -----------------------------------------  Foley placed successfully returning a large amount of urine.  Patient's lab work-up is unremarkable, however the UA is consistent with cystitis.  There are no recent prior urine cultures available, so I will treat empirically with ceftriaxone in the ED and Keflex once patient leaves.  His heart rate has significantly improved after small amount of fluids, and was about 105 when I reassessed the patient.   The patient feels well and would like to go home.  We will continue to give the rest of the 1 L of NS and then discharge home.  Return precautions given, and the patient expresses understanding.  ____________________________________________   FINAL CLINICAL IMPRESSION(S) / ED DIAGNOSES  Final diagnoses:  Urinary retention  Cystitis  Dehydration      NEW MEDICATIONS STARTED DURING THIS VISIT:  New Prescriptions   CEPHALEXIN (KEFLEX) 500 MG CAPSULE    Take 1 capsule (500 mg total) by mouth 2 (two) times daily for 10 days.     Note:  This document was prepared using Dragon voice  recognition software and may include unintentional dictation errors.   Dionne Bucy, MD 06/18/18 1520

## 2018-06-19 ENCOUNTER — Ambulatory Visit: Payer: Medicare Other | Admitting: Urology

## 2018-06-30 ENCOUNTER — Other Ambulatory Visit: Payer: Self-pay | Admitting: Internal Medicine

## 2018-06-30 ENCOUNTER — Telehealth: Payer: Self-pay | Admitting: Urology

## 2018-06-30 ENCOUNTER — Other Ambulatory Visit: Payer: Self-pay

## 2018-06-30 ENCOUNTER — Emergency Department
Admission: EM | Admit: 2018-06-30 | Discharge: 2018-06-30 | Discharge status: Against Medical Advice | Payer: Medicare Other | Attending: Emergency Medicine | Admitting: Emergency Medicine

## 2018-06-30 ENCOUNTER — Ambulatory Visit: Payer: Medicare Other

## 2018-06-30 ENCOUNTER — Emergency Department: Payer: Medicare Other

## 2018-06-30 ENCOUNTER — Encounter: Payer: Self-pay | Admitting: *Deleted

## 2018-06-30 DIAGNOSIS — Z87891 Personal history of nicotine dependence: Secondary | ICD-10-CM | POA: Diagnosis not present

## 2018-06-30 DIAGNOSIS — R600 Localized edema: Secondary | ICD-10-CM

## 2018-06-30 DIAGNOSIS — Z79899 Other long term (current) drug therapy: Secondary | ICD-10-CM | POA: Insufficient documentation

## 2018-06-30 DIAGNOSIS — J441 Chronic obstructive pulmonary disease with (acute) exacerbation: Secondary | ICD-10-CM | POA: Insufficient documentation

## 2018-06-30 DIAGNOSIS — I10 Essential (primary) hypertension: Secondary | ICD-10-CM | POA: Diagnosis not present

## 2018-06-30 DIAGNOSIS — R609 Edema, unspecified: Secondary | ICD-10-CM | POA: Insufficient documentation

## 2018-06-30 DIAGNOSIS — R2242 Localized swelling, mass and lump, left lower limb: Secondary | ICD-10-CM | POA: Diagnosis present

## 2018-06-30 DIAGNOSIS — R062 Wheezing: Secondary | ICD-10-CM | POA: Insufficient documentation

## 2018-06-30 DIAGNOSIS — R339 Retention of urine, unspecified: Secondary | ICD-10-CM

## 2018-06-30 LAB — CBC
HEMATOCRIT: 42.2 % (ref 40.0–52.0)
Hemoglobin: 13.5 g/dL (ref 13.0–18.0)
MCH: 27.3 pg (ref 26.0–34.0)
MCHC: 32 g/dL (ref 32.0–36.0)
MCV: 85.1 fL (ref 80.0–100.0)
Platelets: 276 10*3/uL (ref 150–440)
RBC: 4.96 MIL/uL (ref 4.40–5.90)
RDW: 15 % — AB (ref 11.5–14.5)
WBC: 7 10*3/uL (ref 3.8–10.6)

## 2018-06-30 LAB — BASIC METABOLIC PANEL
Anion gap: 5 (ref 5–15)
BUN: 11 mg/dL (ref 8–23)
CO2: 30 mmol/L (ref 22–32)
Calcium: 8.6 mg/dL — ABNORMAL LOW (ref 8.9–10.3)
Chloride: 101 mmol/L (ref 98–111)
Creatinine, Ser: 0.89 mg/dL (ref 0.61–1.24)
Glucose, Bld: 96 mg/dL (ref 70–99)
POTASSIUM: 3.5 mmol/L (ref 3.5–5.1)
Sodium: 136 mmol/L (ref 135–145)

## 2018-06-30 LAB — BRAIN NATRIURETIC PEPTIDE: B NATRIURETIC PEPTIDE 5: 104 pg/mL — AB (ref 0.0–100.0)

## 2018-06-30 LAB — TROPONIN I: Troponin I: <0.03 ng/mL (ref <0.03)

## 2018-06-30 MED ORDER — PREDNISONE 20 MG PO TABS
40.0000 mg | ORAL_TABLET | Freq: Once | ORAL | Status: AC
  Administered 2018-06-30: 40 mg via ORAL
  Filled 2018-06-30: qty 2

## 2018-06-30 MED ORDER — IPRATROPIUM-ALBUTEROL 0.5-2.5 (3) MG/3ML IN SOLN
3.0000 mL | Freq: Once | RESPIRATORY_TRACT | Status: AC
  Administered 2018-06-30: 3 mL via RESPIRATORY_TRACT

## 2018-06-30 MED ORDER — FUROSEMIDE 20 MG PO TABS
10.0000 mg | ORAL_TABLET | Freq: Once | ORAL | Status: AC
  Administered 2018-06-30: 10 mg via ORAL
  Filled 2018-06-30: qty 0.5

## 2018-06-30 MED ORDER — AZITHROMYCIN 500 MG PO TABS
500.0000 mg | ORAL_TABLET | Freq: Once | ORAL | Status: AC
  Administered 2018-06-30: 500 mg via ORAL
  Filled 2018-06-30: qty 1

## 2018-06-30 MED ORDER — IPRATROPIUM-ALBUTEROL 0.5-2.5 (3) MG/3ML IN SOLN
3.0000 mL | Freq: Once | RESPIRATORY_TRACT | Status: AC
  Administered 2018-06-30: 3 mL via RESPIRATORY_TRACT
  Filled 2018-06-30: qty 3

## 2018-06-30 MED ORDER — POTASSIUM CHLORIDE CRYS ER 20 MEQ PO TBCR
20.0000 meq | EXTENDED_RELEASE_TABLET | Freq: Once | ORAL | Status: AC
  Administered 2018-06-30: 20 meq via ORAL
  Filled 2018-06-30: qty 1

## 2018-06-30 MED ORDER — IPRATROPIUM-ALBUTEROL 0.5-2.5 (3) MG/3ML IN SOLN
RESPIRATORY_TRACT | Status: AC
  Administered 2018-06-30: 3 mL via RESPIRATORY_TRACT
  Filled 2018-06-30: qty 3

## 2018-06-30 NOTE — Telephone Encounter (Signed)
Pt lmom asking to have Frank Todd call him this morning as soon as he can, no details given, pt has appt this morning, please call pt @ [phone]. Thanks

## 2018-06-30 NOTE — ED Notes (Signed)
Resumed care from stephen rn.  Pt alert. Oxygen in place.  Iv in place.  meds given .

## 2018-06-30 NOTE — Progress Notes (Signed)
Catheter Removal  Patient is present today for a catheter removal.  10ml of water was drained from the balloon. A 16FR foley cath was removed from the bladder no complications were noted . Patient tolerated well.  Preformed by: Nydia Boutonhristopher Channing Savich, CMA  Follow up/ Additional notes: Pt encouraged to return to office around 1430 for bladder scan, however he does not wish to. Pt states he would call around 1400 to let me know if he has been able to urinate. I educated pt on risk of urinary retention and the effect on kidneys, even when urinating minimal amounts. Pt states his understanding.

## 2018-06-30 NOTE — ED Triage Notes (Signed)
Pt to triage via wheelchair.  Pt sent to er from urologist office for eval of swelling ankles and feet.  No chest pain.  Pt reports sob. No cough.  Pt alert.

## 2018-06-30 NOTE — Telephone Encounter (Signed)
Pt called office asking to speak to Thayer OhmChris, advised pt that Thayer OhmChris is with other patients and unable to come to the phone right now, pt then proceeded to ask to get the message to Thayer OhmChris that he was able to urinate just a little bit but not much since this morning, advised pt that he needed to come back to the office very soon. Pt sounded frustrated but said okay, he would be in later. fyi

## 2018-06-30 NOTE — ED Notes (Signed)
Iv d'ced.  Pt states he has to leave now.  md aware

## 2018-06-30 NOTE — ED Provider Notes (Signed)
[facility] Emergency Department Provider Note   ____________________________________________   First MD Initiated Contact with Patient 06/30/18 1854     (approximate)  I have reviewed the triage vital signs and the nursing notes.   HISTORY  Chief Complaint Leg Swelling    HPI Frank RecordsClaude H Kestner Jr. is a 67 y.o. male here for evaluation of some slight swelling in both ankles  Patient reports he went to urology today, had an appointment with the nurse removed his catheter.  He reports he had not been able to urinate a lot at the office there, but since then he is urinated twice and is urinated well without difficulty or discomfort.  Overall reports he feels he is voiding well.  Reports the nurse that he saw a recommended he come to the ER because he noticed his ankles looked a little bit swollen.  Report from the last couple of weeks she is noticed his ankles are just slightly swollen, does not seem to be getting any worse.  Had not noticed any trouble breathing except he is developed a slight wheeze and a slight cough starting about 2 days ago and reports it as similar to when he started to have "COPD".  No chest pain.  No nausea vomiting.  No fevers or chills.  Past Medical History:  Diagnosis Date  . Arthritis    shoulders  . Asthma   . Benign prostatic hypertrophy   . COPD (chronic obstructive pulmonary disease) (HCC)    chronic bronchitis  . GERD (gastroesophageal reflux disease)   . Hypertension   . Seasonal allergies   . Shortness of breath dyspnea   . Wears partial dentures    upper    Patient Active Problem List   Diagnosis Date Noted  . Acute respiratory failure (HCC) 03/31/2018  . Microscopic hematuria 12/19/2017  . Pneumonia 11/26/2017  . Chronic respiratory failure with hypoxia (HCC) 11/26/2017  . Candidal stomatitis 11/26/2017  . Atelectasis 11/26/2017  . Solitary pulmonary nodule 11/26/2017  . Panlobular emphysema (HCC) 11/26/2017    . Chronic obstructive pulmonary disease with acute exacerbation (HCC) 11/26/2017  . 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 Hemoptysis 11/26/2017  . Shortness of breath 11/26/2017  . Hypertension, essential 11/26/2017  . Benign prostatic hyperplasia with lower urinary tract symptoms 03/12/2014    Past Surgical History:  Procedure Laterality Date  . CATARACT EXTRACTION W/PHACO Right 03/30/2015   Procedure: CATARACT EXTRACTION PHACO AND INTRAOCULAR LENS PLACEMENT (IOC);  Surgeon: Lockie Molahadwick Brasington, MD;  Location: [facility];  Service: Ophthalmology;  Laterality: Right;  . CIRCUMCISION     as child  . DENTAL SURGERY      Prior to Admission medications   Medication Sig Start Date End Date Taking? Authorizing Provider  ADVAIR DISKUS 500-50 MCG/DOSE AEPB INHALE 1 DOSE BY MOUTH TWICE A DAY 06/30/18   Lyndon CodeKhan, Fozia M, MD  albuterol (VENTOLIN HFA) 108 (90 Base) MCG/ACT inhaler Inhale 2 puffs into the lungs every 6 (six) hours as needed for wheezing or shortness of breath. 04/01/18   Milagros LollSudini, Srikar, MD  ALL DAY ALLERGY 10 MG tablet take 1 tablet by mouth once daily 05/29/18   Yevonne PaxKhan, Saadat A, MD  amLODipine (NORVASC) 5 MG tablet Take 5 mg by mouth daily. for high blood pressure 04/02/18   [provider]  cetirizine (ZYRTEC) 10 MG tablet Take 10 mg by mouth daily as needed for allergies.     [provider]  DALIRESP 500 MCG TABS  tablet take 1 tablet by mouth once daily 06/04/18   Yevonne Pax, MD  fluticasone [facility]) 50 MCG/ACT nasal spray Place into both nostrils 2 (two) times daily.    [provider]  meloxicam (MOBIC) 15 MG tablet Take 15 mg by mouth daily. 03/09/18   [provider]  metoprolol succinate (TOPROL-XL) 25 MG 24 hr tablet Take 25 mg by mouth daily. 02/19/18   [provider]  omeprazole (PRILOSEC) 20 MG capsule Take 20 mg by mouth daily.     [provider]  OXYGEN Inhale 2.5 L into the lungs  continuous.    [provider]  predniSONE (DELTASONE) 10 MG tablet Take 1 tablet (10 mg total) by mouth daily. 02/14/18   Yevonne Pax, MD  predniSONE (DELTASONE) 10 MG tablet Take 1 tablet (10 mg total) by mouth daily. Patient not taking: Reported on 06/18/2018 04/07/18   Milagros Loll, MD  predniSONE (STERAPRED UNI-PAK 21 TAB) 10 MG (21) TBPK tablet 6 tabs day 1 and taper 10 mg a day - 6 days Patient not taking: Reported on 06/18/2018 04/01/18   Milagros Loll, MD  [facility] HANDIHALER 18 MCG inhalation capsule INHALE 2 PUFFS BY MOUTH ONCE DAILY 06/30/18   Lyndon Code, MD  tamsulosin [facility]) 0.4 MG CAPS capsule Take 1 capsule (0.4 mg total) by mouth daily after breakfast. AM 12/18/17   Riki Altes, MD    Allergies Patient has no known allergies.  No family history on file.  Social History Social History   Tobacco Use  . Smoking status: Former Smoker    Last attempt to quit: 11/12/1984    Years since quitting: 33.6  . Smokeless tobacco: Never Used  Substance Use Topics  . Alcohol use: Yes    Alcohol/week: 3.0 standard drinks    Types: 3 Shots of liquor per week    Comment: pt said "several pints per week"  . Drug use: No    Review of Systems Constitutional: No fever/chills Eyes: No visual changes. ENT: No sore throat. Cardiovascular: Denies chest pain. Respiratory: Denies shortness of breath for just some slight wheezing slightly worse over the last 2 days but he does report he chronically has COPD symptoms and is on prednisone daily.  Slight dry cough.  Nonproductive. Gastrointestinal: No abdominal pain.  No nausea, no vomiting.  No diarrhea.  No constipation. Genitourinary: Negative for dysuria. Musculoskeletal: Negative for back pain. Skin: Negative for rash. Neurological: Negative for headaches, focal weakness or numbness.    ____________________________________________   PHYSICAL EXAM:  VITAL SIGNS: ED Triage Vitals  Enc Vitals Group     BP 06/30/18  1720 133/90     Pulse Rate 06/30/18 1720 72     Resp 06/30/18 1720 20     Temp 06/30/18 1720 98.6 F (37 C)     Temp Source 06/30/18 1720 Oral     SpO2 06/30/18 1720 97 %     Weight 06/30/18 1721 130 lb (59 kg)     Height 06/30/18 1721 6\' 3"  (1.905 m)     Head Circumference --      Peak Flow --      Pain Score 06/30/18 1720 0     Pain Loc --      Pain Edu? --      Excl. in GC? --     Constitutional: Alert and oriented. Well appearing and in no acute distress.  Very pleasant.  Resting comfortably with normal vital signs except for some  slight elevation in blood pressure.  Reports he is breathing does feel better after he had a nebulizer treatment eyes: Conjunctivae are normal. Head: Atraumatic. Nose: No congestion/rhinnorhea. Mouth/Throat: Mucous membranes are moist. Neck: No stridor.  No JVD Cardiovascular: Normal rate, regular rhythm. Grossly normal heart sounds.  Good peripheral circulation. Respiratory: Normal respiratory effort.  No retractions. Lungs CTAB except for a slight end expiratory wheeze and increased expiratory phase in the lower lobes bilaterally.  Normal oxygen saturation on 2 L which she reports is baseline over about 2-1/2 L at home.  Denies ongoing shortness of breath at the time of my evaluation. Gastrointestinal: Soft and nontender. No distention. Musculoskeletal: No lower extremity tenderness some very trace bilateral lower extremity ankle only edema.  No calf tenderness.  No venous cords or congestion.  Denies pain in the calves. Neurologic:  Normal speech and language. No gross focal neurologic deficits are appreciated.  Skin:  Skin is warm, dry and intact. No rash noted. Psychiatric: Mood and affect are normal. Speech and behavior are normal.  ____________________________________________   LABS (all labs ordered are listed, but only abnormal results are displayed)  Labs Reviewed  BASIC METABOLIC PANEL - Abnormal; Notable for the following components:       Result Value   Calcium 8.6 (*)    All other components within normal limits  CBC - Abnormal; Notable for the following components:   RDW 15.0 (*)    All other components within normal limits  BRAIN NATRIURETIC PEPTIDE - Abnormal; Notable for the following components:   B Natriuretic Peptide 104.0 (*)    All other components within normal limits  TROPONIN I   ____________________________________________  EKG  Reviewed and interpreted at 1721 Heart rate 75 QRS 80 QTc 440 Sinus rhythm with occasional sinus arrhythmia.  No evidence of acute ischemia.  PVC. ____________________________________________  RADIOLOGY    Chest x-ray reviewed, no acute. ____________________________________________   PROCEDURES  Procedure(s) performed: None  Procedures  Critical Care performed: No  ____________________________________________   INITIAL IMPRESSION / ASSESSMENT AND PLAN / ED COURSE  Pertinent labs & imaging results that were available during my care of the patient were reviewed by me and considered in my medical decision making (see chart for details).  Patient presents for evaluation of primarily some slight ankle edema.  He also reports a long history of COPD and uses 2-1/2 L and does exhibit some slight end expiratory wheezing and reports that he feels like he had just a little bit of increased COPD in the last couple of days.  Lungs are clear except for slight wheezing.  No crackles.  No JVD.  No obvious volume overload except for some trace edema in his ankles and his renal function appears to be preserved.  Saw urology today and had Foley catheter removed, currently reports he is able to urinate and is urinated twice since then does not feel that he is having difficulty voiding  Overall clinical picture was slightly elevated BNP, clear chest x-ray suggest likely some very slight peripheral edema without evidence of acute kidney injury and also perhaps a slight COPD flare.  His  respirations are quite comfortable speaks in full sentences and reports just a very slight feeling of wheezing.  We will increase his prednisone to 40 mg daily for 5 days, provide additional nebulizer treatment here and also give a small single dose of Lasix to assist with peripheral edema.  Discussed with the patient, he is comfortable and understands already when he  would need to return for trouble with urination and follow-up with urology.  In addition we will treat for COPD.  No cardiac symptoms.  Lab work reassuring     I was notified by the nurse after patient had been observed that he had eloped, had his IV discontinued by nursing staff report he can wait any longer and that he needed to go.  He was ambulatory and appeared no distress per Amy.  He left without discharge papers.  Eloped.  I did not have the opportunity discussed return precautions or follow-up care with the patient  ____________________________________________   FINAL CLINICAL IMPRESSION(S) / ED DIAGNOSES  Final diagnoses:  Mild peripheral edema  COPD exacerbation (HCC)      NEW MEDICATIONS STARTED DURING THIS VISIT:  Discharge Medication List as of 06/30/2018  9:00 PM       Note:  This document was prepared using Dragon voice recognition software and may include unintentional dictation errors.     Sharyn Creamer, MD 07/01/18 1047

## 2018-07-01 ENCOUNTER — Telehealth: Payer: Self-pay

## 2018-07-01 ENCOUNTER — Other Ambulatory Visit: Payer: Self-pay

## 2018-07-01 NOTE — Telephone Encounter (Signed)
Pt advised need appt for further refills last

## 2018-07-07 ENCOUNTER — Other Ambulatory Visit: Payer: Self-pay

## 2018-07-07 MED ORDER — TAMSULOSIN HCL 0.4 MG PO CAPS: 0.4000 mg | ORAL_CAPSULE | Freq: Every day | ORAL | 3 refills | Status: DC

## 2018-07-16 ENCOUNTER — Other Ambulatory Visit: Payer: Self-pay | Admitting: Internal Medicine

## 2018-07-17 ENCOUNTER — Emergency Department
Admission: EM | Admit: 2018-07-17 | Discharge: 2018-07-19 | Disposition: A | Discharge status: Home / Self Care | Payer: Medicare Other | Attending: Emergency Medicine | Admitting: Emergency Medicine

## 2018-07-17 ENCOUNTER — Other Ambulatory Visit: Payer: Self-pay

## 2018-07-17 ENCOUNTER — Encounter: Payer: Self-pay | Admitting: Emergency Medicine

## 2018-07-17 DIAGNOSIS — R440 Auditory hallucinations: Secondary | ICD-10-CM | POA: Diagnosis present

## 2018-07-17 DIAGNOSIS — Z87891 Personal history of nicotine dependence: Secondary | ICD-10-CM | POA: Diagnosis not present

## 2018-07-17 DIAGNOSIS — R456 Violent behavior: Secondary | ICD-10-CM

## 2018-07-17 DIAGNOSIS — F29 Unspecified psychosis not due to a substance or known physiological condition: Secondary | ICD-10-CM | POA: Insufficient documentation

## 2018-07-17 DIAGNOSIS — Z79899 Other long term (current) drug therapy: Secondary | ICD-10-CM | POA: Diagnosis not present

## 2018-07-17 DIAGNOSIS — F1410 Cocaine abuse, uncomplicated: Secondary | ICD-10-CM

## 2018-07-17 DIAGNOSIS — F23 Brief psychotic disorder: Secondary | ICD-10-CM

## 2018-07-17 DIAGNOSIS — I10 Essential (primary) hypertension: Secondary | ICD-10-CM | POA: Insufficient documentation

## 2018-07-17 DIAGNOSIS — J449 Chronic obstructive pulmonary disease, unspecified: Secondary | ICD-10-CM | POA: Insufficient documentation

## 2018-07-17 LAB — URINE DRUG SCREEN, QUALITATIVE (ARMC ONLY)
Amphetamines, Ur Screen: NOT DETECTED
Barbiturates, Ur Screen: NOT DETECTED
Cannabinoid 50 Ng, Ur ~~LOC~~: NOT DETECTED
Cocaine Metabolite,Ur ~~LOC~~: POSITIVE — AB
MDMA (ECSTASY) UR SCREEN: NOT DETECTED
METHADONE SCREEN, URINE: NOT DETECTED
OPIATE, UR SCREEN: NOT DETECTED
Phencyclidine (PCP) Ur S: NOT DETECTED
Tricyclic, Ur Screen: NOT DETECTED

## 2018-07-17 LAB — COMPREHENSIVE METABOLIC PANEL
ALBUMIN: 4.4 g/dL (ref 3.5–5.0)
ALT: 14 U/L (ref 0–44)
AST: 24 U/L (ref 15–41)
Alkaline Phosphatase: 49 U/L (ref 38–126)
Anion gap: 10 (ref 5–15)
BUN: 23 mg/dL (ref 8–23)
CHLORIDE: 99 mmol/L (ref 98–111)
CO2: 29 mmol/L (ref 22–32)
CREATININE: 1.14 mg/dL (ref 0.61–1.24)
Calcium: 8.8 mg/dL — ABNORMAL LOW (ref 8.9–10.3)
Glucose, Bld: 130 mg/dL — ABNORMAL HIGH (ref 70–99)
POTASSIUM: 3.9 mmol/L (ref 3.5–5.1)
SODIUM: 138 mmol/L (ref 135–145)
Total Bilirubin: 1 mg/dL (ref 0.3–1.2)
Total Protein: 6.8 g/dL (ref 6.5–8.1)

## 2018-07-17 LAB — CBC
HEMATOCRIT: 44.3 % (ref 40.0–52.0)
Hemoglobin: 14.2 g/dL (ref 13.0–18.0)
MCH: 27 pg (ref 26.0–34.0)
MCHC: 32.1 g/dL (ref 32.0–36.0)
MCV: 84.1 fL (ref 80.0–100.0)
Platelets: 242 10*3/uL (ref 150–440)
RBC: 5.26 MIL/uL (ref 4.40–5.90)
RDW: 14.9 % — AB (ref 11.5–14.5)
WBC: 9.4 10*3/uL (ref 3.8–10.6)

## 2018-07-17 LAB — ETHANOL
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
## 2018-07-17 NOTE — ED Notes (Signed)

## 2018-07-17 NOTE — Consult Note (Signed)
[facility] Face-to-Face Psychiatry Consult   Reason for Consult: Consult for this 67 year old man brought here under petition from [place] because of violent threats and psychosis Referring Physician: Event organiser Patient Identification: Frank Todd. MRN:  [id number] Principal Diagnosis: Psychosis [facility]) Diagnosis:   Patient Active Problem List   Diagnosis Date Noted  . Psychosis ([place]) [F29] 07/17/2018  . Cocaine abuse ([place]) [F14.10] 07/17/2018  . Violent behavior [R45.6] 07/17/2018  . Acute respiratory failure ([place]) [J96.00] 03/31/2018  . Microscopic hematuria [R31.29] 12/19/2017  . Pneumonia [J18.9] 11/26/2017  . Chronic respiratory failure with hypoxia ([place]) [J96.11] 11/26/2017  . Candidal stomatitis [B37.0] 11/26/2017  . Atelectasis [J98.11] 11/26/2017  . Solitary pulmonary nodule [R91.1] 11/26/2017  . 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 Chronic obstructive pulmonary disease with acute exacerbation ([place]) [J44.1] 11/26/2017  . 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 Hemoptysis [R04.2] 11/26/2017  . Shortness of breath [R06.02] 11/26/2017  . Hypertension, essential [I10] 11/26/2017  . Benign prostatic hyperplasia with lower urinary tract symptoms [N40.1] 03/12/2014    Total Time spent with patient: 1 hour  Subjective:   Frank Todd. is a 67 y.o. male patient admitted with "I know this sounds crazy but those people were in my house".  HPI: Patient interviewed chart reviewed.  Patient brought in with a report that he had been brandishing a gun in his house today before his sister was able to get it away from him.  On interview the patient is calm polite and cooperative.  He tells me that he knows that this all sounds strange but that for the last few weeks there have been various people who have been coming into his house at all hours of the day and night.  They are not there to rob him but they are to do demonic spells and magic.  Needless to say  he finds this disturbing and today he says he finally got fed up with it.  There were 3 or 4 people in his house and he says that he held them at gunpoint so that he would have time to call the police.  Patient claims that the police actually came and apprehended the people although I am not sure whether law enforcement actually did come to his house or not.  Patient admits that all of this sounds bizarre but is certain that it happened.  Mood is stated as being a little bit nervous.  Sleep by his report is fine.  Appetite fairly decent.  Patient admits that he smokes crack cocaine several times a week.  He also drinks about 3 of the 16 ounce size beers per day.  He has multiple chronic medical problems.  Denies any significant new stresses.  Does mention that he has been feeling sad ever since his brother died a year ago.  Social history: Patient lives independently.  Retired Hotel manager.  Sister appears to be the closest relative who probably keeps an eye on him more than anyone else.  Medical history: Patient has COPD and apparently is supposed to be on home oxygen.  Also hypertension.  Does not know of any history of stroke or myocardial infarction.  Substance abuse history: Patient indicates that he has been using cocaine intermittently for years.  Also drinks regularly.  Does not think he is ever been in any kind of specific treatment for it.  Past Psychiatric History: Denies any past psychiatric history.  Denies ever having seen a mental health provider in any context.  Denies any history of suicide attempts or violence.  Risk to Self: Suicidal Ideation: No Suicidal Intent: No Is patient at risk for suicide?: No Suicidal Plan?: No Access to Means: No What has been your use of drugs/alcohol within the last 12 months?: Cocaine & Alcohol How many times?: 0 Other Self Harm Risks: Active Addiction Triggers for Past Attempts: None known Intentional Self Injurious Behavior: None Risk to Others:  Homicidal Ideation: No Thoughts of Harm to Others: No Current Homicidal Intent: No Current Homicidal Plan: No Access to Homicidal Means: No Identified Victim: Reports of none History of harm to others?: No Assessment of Violence: None Noted Violent Behavior Description: Reports of none Does patient have access to weapons?: No Criminal Charges Pending?: No Does patient have a court date: No Prior Inpatient Therapy: Prior Inpatient Therapy: Yes Prior Outpatient Therapy:    Past Medical History:  Past Medical History:  Diagnosis Date  . Arthritis    shoulders  . Asthma   . Benign prostatic hypertrophy   . COPD (chronic obstructive pulmonary disease) (HCC)    chronic bronchitis  . GERD (gastroesophageal reflux disease)   . Hypertension   . Seasonal allergies   . Shortness of breath dyspnea   . Wears partial dentures    upper    Past Surgical History:  Procedure Laterality Date  . CATARACT EXTRACTION W/PHACO Right 03/30/2015   Procedure: CATARACT EXTRACTION PHACO AND INTRAOCULAR LENS PLACEMENT (IOC);  Surgeon: Leandrew Koyanagi, MD;  Location: [place];  Service: Ophthalmology;  Laterality: Right;  . CIRCUMCISION     as child  . DENTAL SURGERY     Family History: History reviewed. No pertinent family history. Family Psychiatric  History: No known family history Social History:  Social History   Substance and Sexual Activity  Alcohol Use Yes  . Alcohol/week: 3.0 standard drinks  . Types: 3 Shots of liquor per week   Comment: pt said "several pints per week"     Social History   Substance and Sexual Activity  Drug Use No    Social History   Socioeconomic History  . Marital status: Divorced    Spouse name: Not on file  . Number of children: Not on file  . Years of education: Not on file  . Highest education level: Not on file  Occupational History  . Not on file  Social Needs  . Financial resource strain: Not on file  . Food insecurity:     Worry: Not on file    Inability: Not on file  . Transportation needs:    Medical: Not on file    Non-medical: Not on file  Tobacco Use  . Smoking status: Former Smoker    Last attempt to quit: 11/12/1984    Years since quitting: 33.6  . Smokeless tobacco: Never Used  Substance and Sexual Activity  . Alcohol use: Yes    Alcohol/week: 3.0 standard drinks    Types: 3 Shots of liquor per week    Comment: pt said "several pints per week"  . Drug use: No  . Sexual activity: Not Currently  Lifestyle  . Physical activity:    Days per week: Not on file    Minutes per session: Not on file  . Stress: Not on file  Relationships  . Social connections:    Talks on phone: Not on file    Gets together: Not on file    Attends religious service: Not on file    Active member of club  or organization: Not on file    Attends meetings of clubs or organizations: Not on file    Relationship status: Not on file  Other Topics Concern  . Not on file  Social History Narrative  . Not on file   Additional Social History:    Allergies:  No Known Allergies  Labs:  Results for orders placed or performed during the hospital encounter of 07/17/18 (from the past 48 hour(s))  Urine Drug Screen, Qualitative     Status: Abnormal   Collection Time: 07/17/18  4:01 PM  Result Value Ref Range   Tricyclic, Ur Screen NONE DETECTED NONE DETECTED   Amphetamines, Ur Screen NONE DETECTED NONE DETECTED   MDMA (Ecstasy)Ur Screen NONE DETECTED NONE DETECTED   Cocaine Metabolite,Ur [place] POSITIVE (A) NONE DETECTED   Opiate, Ur Screen NONE DETECTED NONE DETECTED   Phencyclidine (PCP) Ur S NONE DETECTED NONE DETECTED   Cannabinoid 50 Ng, Ur  NONE DETECTED NONE DETECTED   Barbiturates, Ur Screen NONE DETECTED NONE DETECTED   Benzodiazepine, Ur Scrn TEST NOT PERFORMED, REAGENT NOT AVAILABLE (A) NONE DETECTED   Methadone Scn, Ur NONE DETECTED NONE DETECTED    Comment: (NOTE) Tricyclics + metabolites, urine    Cutoff 1000  ng/mL Amphetamines + metabolites, urine  Cutoff 1000 ng/mL MDMA (Ecstasy), urine              Cutoff 500 ng/mL Cocaine Metabolite, urine          Cutoff 300 ng/mL Opiate + metabolites, urine        Cutoff 300 ng/mL Phencyclidine (PCP), urine         Cutoff 25 ng/mL Cannabinoid, urine                 Cutoff 50 ng/mL Barbiturates + metabolites, urine  Cutoff 200 ng/mL Benzodiazepine, urine              Cutoff 200 ng/mL Methadone, urine                   Cutoff 300 ng/mL The urine drug screen provides only a preliminary, unconfirmed analytical test result and should not be used for non-medical purposes. Clinical consideration and professional judgment should be applied to any positive drug screen result due to possible interfering substances. A more specific alternate chemical method must be used in order to obtain a confirmed analytical result. Gas chromatography / mass spectrometry (GC/MS) is the preferred confirmat ory method. Performed at [facility], [address] [place], [place] [address]   Comprehensive metabolic panel     Status: Abnormal   Collection Time: 07/17/18  4:02 PM  Result Value Ref Range   Sodium 138 135 - 145 mmol/L   Potassium 3.9 3.5 - 5.1 mmol/L   Chloride 99 98 - 111 mmol/L   CO2 29 22 - 32 mmol/L   Glucose, Bld 130 (H) 70 - 99 mg/dL   BUN 23 8 - 23 mg/dL   Creatinine, Ser 1.14 0.61 - 1.24 mg/dL   Calcium 8.8 (L) 8.9 - 10.3 mg/dL   Total Protein 6.8 6.5 - 8.1 g/dL   Albumin 4.4 3.5 - 5.0 g/dL   AST 24 15 - 41 U/L   ALT 14 0 - 44 U/L   Alkaline Phosphatase 49 38 - 126 U/L   Total Bilirubin 1.0 0.3 - 1.2 mg/dL   GFR calc non Af Amer >60 >60 mL/min   GFR calc Af Amer >60 >60 mL/min    Comment: (  NOTE) The eGFR has been calculated using the CKD EPI equation. This calculation has not been validated in all clinical situations. eGFR's persistently <60 mL/min signify possible Chronic Kidney Disease.    Anion gap 10 5 - 15    Comment: Performed  at [facility], [address] [place], [place] [address]  Ethanol     Status: None   Collection Time: 07/17/18  4:02 PM  Result Value Ref Range   Alcohol, Ethyl (B) <10 <10 mg/dL    Comment: (NOTE) Lowest detectable limit for serum alcohol is 10 mg/dL. For medical purposes only. Performed at [facility], [address] [place], [place] [address]   cbc     Status: Abnormal   Collection Time: 07/17/18  4:02 PM  Result Value Ref Range   WBC 9.4 3.8 - 10.6 K/uL   RBC 5.26 4.40 - 5.90 MIL/uL   Hemoglobin 14.2 13.0 - 18.0 g/dL   HCT 44.3 40.0 - 52.0 %   MCV 84.1 80.0 - 100.0 fL   MCH 27.0 26.0 - 34.0 pg   MCHC 32.1 32.0 - 36.0 g/dL   RDW 14.9 (H) 11.5 - 14.5 %   Platelets 242 150 - 440 K/uL    Comment: Performed at [facility], [address] [place], [place] [address]    Current Facility-Administered Medications  Medication Dose Route Frequency Provider Last Rate Last Dose  . lidocaine (XYLOCAINE) 2 % jelly 1 application  1 application Urethral Once Stoioff, Ronda Fairly, MD       Current Outpatient Medications  Medication Sig Dispense Refill  . ADVAIR DISKUS 500-50 MCG/DOSE AEPB INHALE 1 DOSE BY MOUTH TWICE A DAY 1 each 2  . albuterol (VENTOLIN HFA) 108 (90 Base) MCG/ACT inhaler Inhale 2 puffs into the lungs every 6 (six) hours as needed for wheezing or shortness of breath. 1 Inhaler 1  . ALL DAY ALLERGY 10 MG tablet take 1 tablet by mouth once daily 30 tablet 7  . amLODipine (NORVASC) 5 MG tablet Take 5 mg by mouth daily. for high blood pressure  0  . cetirizine (ZYRTEC) 10 MG tablet Take 10 mg by mouth daily as needed for allergies.     Marland Kitchen DALIRESP 500 MCG TABS tablet take 1 tablet by mouth once daily 30 tablet 2  . fluticasone (FLONASE) 50 MCG/ACT nasal spray Place into both nostrils 2 (two) times daily.    . meloxicam (MOBIC) 15 MG tablet Take 15 mg by mouth daily.  0  . metoprolol succinate (TOPROL-XL) 25 MG 24 hr tablet Take 25 mg by mouth  daily.  0  . omeprazole (PRILOSEC) 20 MG capsule Take 20 mg by mouth daily.     . OXYGEN Inhale 2.5 L into the lungs continuous.    . predniSONE (DELTASONE) 10 MG tablet Take 1 tablet (10 mg total) by mouth daily. 30 tablet 1  . predniSONE (DELTASONE) 10 MG tablet Take 1 tablet (10 mg total) by mouth daily. (Patient not taking: Reported on 06/18/2018) 30 tablet 0  . predniSONE (STERAPRED UNI-PAK 21 TAB) 10 MG (21) TBPK tablet 6 tabs day 1 and taper 10 mg a day - 6 days (Patient not taking: Reported on 06/18/2018) 21 tablet 0  . SPIRIVA HANDIHALER 18 MCG inhalation capsule INHALE 2 PUFFS BY MOUTH ONCE DAILY 30 capsule 0  . tamsulosin (FLOMAX) 0.4 MG CAPS capsule Take 1 capsule (0.4 mg total) by mouth daily after breakfast. AM 90 capsule 3    Musculoskeletal:  Strength & Muscle Tone: decreased and atrophy Gait & Station: unsteady Patient leans: N/A  Psychiatric Specialty Exam: Physical Exam  Nursing note and vitals reviewed. Constitutional: He appears well-developed.  HENT:  Head: Normocephalic and atraumatic.  Eyes: Pupils are equal, round, and reactive to light. Conjunctivae are normal.  Neck: Normal range of motion.  Cardiovascular: Regular rhythm and normal heart sounds.  Respiratory: Effort normal. No respiratory distress.  GI: Soft.  Musculoskeletal: Normal range of motion.  Neurological: He is alert.  Skin: Skin is warm and dry.  Psychiatric: He has a normal mood and affect. His speech is delayed. He is not agitated and not aggressive. Thought content is paranoid and delusional. He expresses impulsivity and inappropriate judgment. He exhibits abnormal recent memory.    Review of Systems  Constitutional: Negative.   HENT: Negative.   Eyes: Negative.   Respiratory: Negative.   Cardiovascular: Negative.   Gastrointestinal: Negative.   Musculoskeletal: Negative.   Skin: Negative.   Neurological: Negative.   Psychiatric/Behavioral: Positive for hallucinations, memory loss and  substance abuse. Negative for depression and suicidal ideas. The patient is nervous/anxious and has insomnia.     Blood pressure 121/85, pulse 94, temperature 98.8 F (37.1 C), temperature source Oral, resp. rate 17, height 6' 3"  (1.905 m), weight 56.7 kg, SpO2 96 %.Body mass index is 15.62 kg/m.  General Appearance: Casual  Eye Contact:  Good  Speech:  Slow  Volume:  Decreased  Mood:  Euthymic  Affect:  Constricted  Thought Process:  Disorganized  Orientation:  Full (Time, Place, and Person)  Thought Content:  Illogical, Delusions, Hallucinations: Visual and Paranoid Ideation  Suicidal Thoughts:  No  Homicidal Thoughts:  No  Memory:  Immediate;   Fair Recent;   Poor Remote;   Fair  Judgement:  Impaired  Insight:  Shallow  Psychomotor Activity:  Decreased  Concentration:  Concentration: Fair  Recall:  [employer] of Knowledge:  Fair  Language:  Fair  Akathisia:  No  Handed:  Right  AIMS (if indicated):     Assets:  Resilience Social Support  ADL's:  Intact  Cognition:  Impaired,  Mild  Sleep:        Treatment Plan Summary: Daily contact with patient to assess and evaluate symptoms and progress in treatment, Medication management and Plan 67 year old man with visual hallucinations delusions and psychotic behavior and unfortunately also with a gun at home.  In fact the patient tearfully tells me that he has several guns at home including the one that was involved today.  Patient's insight is not very good even though he says he knows that his story sounds crazy.  Differential diagnosis would include the effects of dementia, possible brain disease related to his multiple medical problems including possible hypoxia as well as of course the toxic effects of alcohol and cocaine.  Patient does not appear to be safe to go home in the current condition.  Recommend that we refer him to geriatric psychiatry.  Reviewed medical situation with ER doctor.  We can keep an eye on him for any  signs of detox if needed.  Meanwhile work on referral to outside facilities  Disposition: Recommend psychiatric Inpatient admission when medically cleared. Supportive therapy provided about ongoing stressors.  Alethia Berthold, MD 07/17/2018 6:26 PM

## 2018-07-17 NOTE — ED Notes (Signed)
Linda Turner(Sister of patient) wanted update on patient.  Bonita Quin was given visiting hours and phone hours.  Bonita Quin stated she would call or come visit tomorrow.

## 2018-07-17 NOTE — ED Provider Notes (Signed)
[facility] Emergency Department Provider Note ___________________________________________   First MD Initiated Contact with Patient 07/17/18 1627     (approximate)  I have reviewed the triage vital signs and the nursing notes.   HISTORY  Chief Complaint Psychiatric Evaluation  HPI Frank Todd. is a 67 y.o. male with a history of COPD, hypertension and cocaine abuse was presented to the emergency department for aggressive behavior with hallucinations.  He said that he has smoked crack cocaine recently.  Presented from [facility] earlier today with IVC paperwork completed that said that the patient had drawn a gun at home and said there were demons in his house.  He says that he believes that people he did not know, the demons, or coming to his home to interfere with his personal affairs.   Past Medical History:  Diagnosis Date  . Arthritis    shoulders  . Asthma   . Benign prostatic hypertrophy   . COPD (chronic obstructive pulmonary disease) (HCC)    chronic bronchitis  . GERD (gastroesophageal reflux disease)   . Hypertension   . Seasonal allergies   . Shortness of breath dyspnea   . Wears partial dentures    upper    Patient Active Problem List   Diagnosis Date Noted  . Acute respiratory failure (HCC) 03/31/2018  . Microscopic hematuria 12/19/2017  . Pneumonia 11/26/2017  . Chronic respiratory failure with hypoxia (HCC) 11/26/2017  . Candidal stomatitis 11/26/2017  . Atelectasis 11/26/2017  . Solitary pulmonary nodule 11/26/2017  . Panlobular emphysema (HCC) 11/26/2017  . Chronic obstructive pulmonary disease with acute exacerbation (HCC) 11/26/2017  . 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 Hemoptysis 11/26/2017  . Shortness of breath 11/26/2017  . Hypertension, essential 11/26/2017  . Benign prostatic hyperplasia with lower urinary tract symptoms 03/12/2014    Past Surgical History:  Procedure Laterality Date  .  CATARACT EXTRACTION W/PHACO Right 03/30/2015   Procedure: CATARACT EXTRACTION PHACO AND INTRAOCULAR LENS PLACEMENT (IOC);  Surgeon: Lockie Mola, MD;  Location: [facility];  Service: Ophthalmology;  Laterality: Right;  . CIRCUMCISION     as child  . DENTAL SURGERY      Prior to Admission medications   Medication Sig Start Date End Date Taking? Authorizing Provider  ADVAIR DISKUS 500-50 MCG/DOSE AEPB INHALE 1 DOSE BY MOUTH TWICE A DAY 06/30/18   Lyndon Code, MD  albuterol (VENTOLIN HFA) 108 (90 Base) MCG/ACT inhaler Inhale 2 puffs into the lungs every 6 (six) hours as needed for wheezing or shortness of breath. 04/01/18   Milagros Loll, MD  ALL DAY ALLERGY 10 MG tablet take 1 tablet by mouth once daily 05/29/18   Yevonne Pax, MD  amLODipine (NORVASC) 5 MG tablet Take 5 mg by mouth daily. for high blood pressure 04/02/18   [provider]  cetirizine (ZYRTEC) 10 MG tablet Take 10 mg by mouth daily as needed for allergies.     [provider]  DALIRESP 500 MCG TABS tablet take 1 tablet by mouth once daily 06/04/18   Yevonne Pax, MD  fluticasone [facility]) 50 MCG/ACT nasal spray Place into both nostrils 2 (two) times daily.    [provider]  meloxicam (MOBIC) 15 MG tablet Take 15 mg by mouth daily. 03/09/18   [provider]  metoprolol succinate (TOPROL-XL) 25 MG 24 hr tablet Take 25 mg by mouth daily. 02/19/18   [provider]  omeprazole (PRILOSEC) 20 MG capsule Take 20 mg  by mouth daily.     [provider]  OXYGEN Inhale 2.5 L into the lungs continuous.    [provider]  predniSONE (DELTASONE) 10 MG tablet Take 1 tablet (10 mg total) by mouth daily. 02/14/18   Yevonne Pax, MD  predniSONE (DELTASONE) 10 MG tablet Take 1 tablet (10 mg total) by mouth daily. Patient not taking: Reported on 06/18/2018 04/07/18   Milagros Loll, MD  predniSONE (STERAPRED UNI-PAK 21 TAB) 10 MG (21) TBPK tablet 6 tabs day 1 and taper  10 mg a day - 6 days Patient not taking: Reported on 06/18/2018 04/01/18   Milagros Loll, MD  [facility] HANDIHALER 18 MCG inhalation capsule INHALE 2 PUFFS BY MOUTH ONCE DAILY 06/30/18   Lyndon Code, MD  tamsulosin [facility]) 0.4 MG CAPS capsule Take 1 capsule (0.4 mg total) by mouth daily after breakfast. AM 07/07/18   Stoioff, Verna Czech, MD    Allergies Patient has no known allergies.  History reviewed. No pertinent family history.  Social History Social History   Tobacco Use  . Smoking status: Former Smoker    Last attempt to quit: 11/12/1984    Years since quitting: 33.6  . Smokeless tobacco: Never Used  Substance Use Topics  . Alcohol use: Yes    Alcohol/week: 3.0 standard drinks    Types: 3 Shots of liquor per week    Comment: pt said "several pints per week"  . Drug use: No    Review of Systems  Constitutional: No fever/chills Eyes: No visual changes. ENT: No sore throat. Cardiovascular: Denies chest pain. Respiratory: Denies shortness of breath. Gastrointestinal: No abdominal pain.  No nausea, no vomiting.  No diarrhea.  No constipation. Genitourinary: Negative for dysuria. Musculoskeletal: Negative for back pain. Skin: Negative for rash. Neurological: Negative for headaches, focal weakness or numbness.   ____________________________________________   PHYSICAL EXAM:  VITAL SIGNS: ED Triage Vitals  Enc Vitals Group     BP 07/17/18 1553 131/71     Pulse Rate 07/17/18 1553 99     Resp --      Temp 07/17/18 1553 99.2 F (37.3 C)     Temp Source 07/17/18 1553 Oral     SpO2 07/17/18 1553 97 %     Weight 07/17/18 1554 125 lb (56.7 kg)     Height 07/17/18 1554 6\' 3"  (1.905 m)     Head Circumference --      Peak Flow --      Pain Score 07/17/18 1559 0     Pain Loc --      Pain Edu? --      Excl. in GC? --     Constitutional: Alert and oriented. Well appearing and in no acute distress. Eyes: Conjunctivae are normal.  Head: Atraumatic. Nose: No  congestion/rhinnorhea. Mouth/Throat: Mucous membranes are moist.  Neck: No stridor.   Cardiovascular: Normal rate, regular rhythm. Grossly normal heart sounds.   Respiratory: Normal respiratory effort.  No retractions. Lungs CTAB. Gastrointestinal: Soft and nontender. No distention.  Musculoskeletal: Bilateral moderate peripheral edema which the patient says is baseline. Neurologic:  Normal speech and language. No gross focal neurologic deficits are appreciated. Skin:  Skin is warm, dry and intact. No rash noted. Psychiatric: Patient is calm and cooperative and tells me about the demons with a straight face and measured tone.  He is not tangential.  And does not have pressured speech nor is he agitated.  ____________________________________________   LABS (all labs ordered are listed, but  only abnormal results are displayed)  Labs Reviewed  COMPREHENSIVE METABOLIC PANEL - Abnormal; Notable for the following components:      Result Value   Glucose, Bld 130 (*)    Calcium 8.8 (*)    All other components within normal limits  CBC - Abnormal; Notable for the following components:   RDW 14.9 (*)    All other components within normal limits  ETHANOL  URINE DRUG SCREEN, QUALITATIVE ([facility] ONLY)   ____________________________________________  EKG   ____________________________________________  RADIOLOGY   ____________________________________________   PROCEDURES  Procedure(s) performed:   Procedures  Critical Care performed:   ____________________________________________   INITIAL IMPRESSION / ASSESSMENT AND PLAN / ED COURSE  Pertinent labs & imaging results that were available during my care of the patient were reviewed by me and considered in my medical decision making (see chart for details).  DDX: Psychosis, substance-induced psychosis, polysubstance abuse As part of my medical decision making, I reviewed the following data within the electronic MEDICAL RECORD NUMBER  Notes from prior ED visits  I will uphold the IVC previously completed at [facility].  Patient aware of need to see psychiatry. ____________________________________________   FINAL CLINICAL IMPRESSION(S) / ED DIAGNOSES  Substance abuse.  Psychosis.  NEW MEDICATIONS STARTED DURING THIS VISIT:  New Prescriptions   No medications on file     Note:  This document was prepared using Dragon voice recognition software and may include unintentional dictation errors.     Myrna Blazer, MD 07/17/18 [phone]

## 2018-07-17 NOTE — ED Triage Notes (Signed)
PT brought in by [employer] with IVC papers, He thinks there are demons in his house, he had a gun today and was trying to shoot them, his sister was able to get the gun away from him.

## 2018-07-17 NOTE — ED Notes (Signed)
Pt. Currently sleeping in bed. 

## 2018-07-17 NOTE — ED Notes (Signed)
Dinner tray and drink given to pt 

## 2018-07-17 NOTE — ED Notes (Signed)
Pt reports he wears 2.5L of oxygen chronically - he has been moved to a room  O2placed at 2.5L  Continue to monitor

## 2018-07-17 NOTE — ED Notes (Signed)
BEHAVIORAL HEALTH ROUNDING Patient sleeping: No. Patient alert and oriented: yes Behavior appropriate: Yes.  ; If no, describe:  Nutrition and fluids offered: yes Toileting and hygiene offered: Yes  Sitter present: q15 minute observations and security  monitoring Law enforcement present: Yes  ODS  

## 2018-07-18 ENCOUNTER — Ambulatory Visit: Payer: Self-pay | Admitting: Adult Health

## 2018-07-18 DIAGNOSIS — F29 Unspecified psychosis not due to a substance or known physiological condition: Secondary | ICD-10-CM | POA: Diagnosis not present

## 2018-07-18 DIAGNOSIS — F23 Brief psychotic disorder: Secondary | ICD-10-CM | POA: Diagnosis not present

## 2018-07-18 MED ORDER — ALBUTEROL SULFATE (2.5 MG/3ML) 0.083% IN NEBU: 2.5000 mg | INHALATION_SOLUTION | Freq: Four times a day (QID) | RESPIRATORY_TRACT | Status: DC | PRN

## 2018-07-18 MED ORDER — ROFLUMILAST 500 MCG PO TABS
500.0000 ug | ORAL_TABLET | Freq: Every day | ORAL | Status: DC
  Administered 2018-07-18 – 2018-07-19 (×2): 500 ug via ORAL
  Filled 2018-07-18 (×2): qty 1

## 2018-07-18 MED ORDER — TAMSULOSIN HCL 0.4 MG PO CAPS
0.4000 mg | ORAL_CAPSULE | Freq: Every day | ORAL | Status: DC
  Administered 2018-07-18 – 2018-07-19 (×2): 0.4 mg via ORAL
  Filled 2018-07-18 (×2): qty 1

## 2018-07-18 MED ORDER — MOMETASONE FURO-FORMOTEROL FUM 200-5 MCG/ACT IN AERO
2.0000 | INHALATION_SPRAY | Freq: Two times a day (BID) | RESPIRATORY_TRACT | Status: DC
  Administered 2018-07-18 – 2018-07-19 (×2): 2 via RESPIRATORY_TRACT
  Filled 2018-07-18: qty 8.8

## 2018-07-18 MED ORDER — TIOTROPIUM BROMIDE MONOHYDRATE 18 MCG IN CAPS
18.0000 ug | ORAL_CAPSULE | Freq: Every day | RESPIRATORY_TRACT | Status: DC
  Administered 2018-07-18 – 2018-07-19 (×2): 18 ug via RESPIRATORY_TRACT
  Filled 2018-07-18: qty 5

## 2018-07-18 MED ORDER — METOPROLOL SUCCINATE ER 50 MG PO TB24
25.0000 mg | ORAL_TABLET | Freq: Every day | ORAL | Status: DC
  Administered 2018-07-18 – 2018-07-19 (×2): 25 mg via ORAL
  Filled 2018-07-18 (×2): qty 1

## 2018-07-18 MED ORDER — PANTOPRAZOLE SODIUM 40 MG PO TBEC
40.0000 mg | DELAYED_RELEASE_TABLET | Freq: Every day | ORAL | Status: DC
  Administered 2018-07-18 – 2018-07-19 (×2): 40 mg via ORAL
  Filled 2018-07-18 (×2): qty 1

## 2018-07-18 MED ORDER — LORATADINE 10 MG PO TABS
10.0000 mg | ORAL_TABLET | Freq: Every day | ORAL | Status: DC
  Administered 2018-07-18 – 2018-07-19 (×2): 10 mg via ORAL
  Filled 2018-07-18 (×2): qty 1

## 2018-07-18 MED ORDER — AMLODIPINE BESYLATE 5 MG PO TABS
5.0000 mg | ORAL_TABLET | Freq: Every day | ORAL | Status: DC
  Administered 2018-07-18 – 2018-07-19 (×2): 5 mg via ORAL
  Filled 2018-07-18 (×2): qty 1

## 2018-07-18 NOTE — Consult Note (Signed)
[facility] Face-to-Face Psychiatry Consult   Reason for Consult: Consult follow-up for this 67 year old man brought to the hospital yesterday with psychotic symptoms Referring Physician: Corky Downs Patient Identification: Frank Todd. MRN:  [id number] Principal Diagnosis: Psychosis [facility]) Diagnosis:   Patient Active Problem List   Diagnosis Date Noted  . Psychosis ([place]) [F29] 07/17/2018  . Cocaine abuse ([place]) [F14.10] 07/17/2018  . Violent behavior [R45.6] 07/17/2018  . Acute respiratory failure ([place]) [J96.00] 03/31/2018  . Microscopic hematuria [R31.29] 12/19/2017  . Pneumonia [J18.9] 11/26/2017  . Chronic respiratory failure with hypoxia ([place]) [J96.11] 11/26/2017  . Candidal stomatitis [B37.0] 11/26/2017  . Atelectasis [J98.11] 11/26/2017  . Solitary pulmonary nodule [R91.1] 11/26/2017  . 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 Chronic obstructive pulmonary disease with acute exacerbation ([place]) [J44.1] 11/26/2017  . 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 Hemoptysis [R04.2] 11/26/2017  . Shortness of breath [R06.02] 11/26/2017  . Hypertension, essential [I10] 11/26/2017  . Benign prostatic hyperplasia with lower urinary tract symptoms [N40.1] 03/12/2014    Total Time spent with patient: 30 minutes  Subjective:   Frank Todd. is a 67 y.o. male patient admitted with "I have had some time to reflect".  HPI: Patient seen chart reviewed.  Case reviewed with nursing and TTS.  This is a 67 year old man who was brought to the hospital after becoming psychotic at home having visual hallucinations brandishing a firearm at them.  On interview today the patient says that he feels like he has had time to think about his situation and to try to think of better ways he could have dealt with it.  Nevertheless it is still clear that he believes that there really were people sneaking into his house.  Patient is calm and pleasant.  Not suicidal or homicidal now.   Able to carry on a pretty lucid conversation although still a little bit slow.  Physically feeling better since we got him on his oxygen.  Has not been acting out any.  Past Psychiatric History: Does not appear to have much in the way of significant past psychiatric history other than probably the substance abuse  Risk to Self: Suicidal Ideation: No Suicidal Intent: No Is patient at risk for suicide?: No Suicidal Plan?: No Access to Means: No What has been your use of drugs/alcohol within the last 12 months?: Cocaine & Alcohol How many times?: 0 Other Self Harm Risks: Active Addiction Triggers for Past Attempts: None known Intentional Self Injurious Behavior: None Risk to Others: Homicidal Ideation: No Thoughts of Harm to Others: No Current Homicidal Intent: No Current Homicidal Plan: No Access to Homicidal Means: No Identified Victim: Reports of none History of harm to others?: No Assessment of Violence: None Noted Violent Behavior Description: Reports of none Does patient have access to weapons?: No Criminal Charges Pending?: No Does patient have a court date: No Prior Inpatient Therapy: Prior Inpatient Therapy: Yes Prior Therapy Dates: 2016 Prior Therapy Facilty/Provider(s): Detox Facility Reason for Treatment: SA Treatment Prior Outpatient Therapy: Prior Outpatient Therapy: No Does patient have an ACCT team?: No Does patient have Intensive In-House Services?  : No Does patient have [facility] services? : No Does patient have P4CC services?: No  Past Medical History:  Past Medical History:  Diagnosis Date  . Arthritis    shoulders  . Asthma   . Benign prostatic hypertrophy   . COPD (chronic obstructive pulmonary disease) (HCC)    chronic bronchitis  . GERD (gastroesophageal reflux disease)   .  Hypertension   . Seasonal allergies   . Shortness of breath dyspnea   . Wears partial dentures    upper    Past Surgical History:  Procedure Laterality Date  . CATARACT  EXTRACTION W/PHACO Right 03/30/2015   Procedure: CATARACT EXTRACTION PHACO AND INTRAOCULAR LENS PLACEMENT (IOC);  Surgeon: Leandrew Koyanagi, MD;  Location: [facility];  Service: Ophthalmology;  Laterality: Right;  . CIRCUMCISION     as child  . DENTAL SURGERY     Family History: History reviewed. No pertinent family history. Family Psychiatric  History: Unknown Social History:  Social History   Substance and Sexual Activity  Alcohol Use Yes  . Alcohol/week: 3.0 standard drinks  . Types: 3 Shots of liquor per week   Comment: pt said "several pints per week"     Social History   Substance and Sexual Activity  Drug Use No    Social History   Socioeconomic History  . Marital status: Divorced    Spouse name: Not on file  . Number of children: Not on file  . Years of education: Not on file  . Highest education level: Not on file  Occupational History  . Not on file  Social Needs  . Financial resource strain: Not on file  . Food insecurity:    Worry: Not on file    Inability: Not on file  . Transportation needs:    Medical: Not on file    Non-medical: Not on file  Tobacco Use  . Smoking status: Former Smoker    Last attempt to quit: 11/12/1984    Years since quitting: 33.7  . Smokeless tobacco: Never Used  Substance and Sexual Activity  . Alcohol use: Yes    Alcohol/week: 3.0 standard drinks    Types: 3 Shots of liquor per week    Comment: pt said "several pints per week"  . Drug use: No  . Sexual activity: Not Currently  Lifestyle  . Physical activity:    Days per week: Not on file    Minutes per session: Not on file  . Stress: Not on file  Relationships  . Social connections:    Talks on phone: Not on file    Gets together: Not on file    Attends religious service: Not on file    Active member of club or organization: Not on file    Attends meetings of clubs or organizations: Not on file    Relationship status: Not on file  Other Topics Concern  .  Not on file  Social History Narrative  . Not on file   Additional Social History:    Allergies:  No Known Allergies  Labs:  Results for orders placed or performed during the hospital encounter of 07/17/18 (from the past 48 hour(s))  Urine Drug Screen, Qualitative     Status: Abnormal   Collection Time: 07/17/18  4:01 PM  Result Value Ref Range   Tricyclic, Ur Screen NONE DETECTED NONE DETECTED   Amphetamines, Ur Screen NONE DETECTED NONE DETECTED   MDMA (Ecstasy)Ur Screen NONE DETECTED NONE DETECTED   Cocaine Metabolite,Ur [place] POSITIVE (A) NONE DETECTED   Opiate, Ur Screen NONE DETECTED NONE DETECTED   Phencyclidine (PCP) Ur S NONE DETECTED NONE DETECTED   Cannabinoid 50 Ng, Ur [place] NONE DETECTED NONE DETECTED   Barbiturates, Ur Screen NONE DETECTED NONE DETECTED   Benzodiazepine, Ur Scrn TEST NOT PERFORMED, REAGENT NOT AVAILABLE (A) NONE DETECTED   Methadone Scn, Ur NONE DETECTED NONE DETECTED  Comment: (NOTE) Tricyclics + metabolites, urine    Cutoff 1000 ng/mL Amphetamines + metabolites, urine  Cutoff 1000 ng/mL MDMA (Ecstasy), urine              Cutoff 500 ng/mL Cocaine Metabolite, urine          Cutoff 300 ng/mL Opiate + metabolites, urine        Cutoff 300 ng/mL Phencyclidine (PCP), urine         Cutoff 25 ng/mL Cannabinoid, urine                 Cutoff 50 ng/mL Barbiturates + metabolites, urine  Cutoff 200 ng/mL Benzodiazepine, urine              Cutoff 200 ng/mL Methadone, urine                   Cutoff 300 ng/mL The urine drug screen provides only a preliminary, unconfirmed analytical test result and should not be used for non-medical purposes. Clinical consideration and professional judgment should be applied to any positive drug screen result due to possible interfering substances. A more specific alternate chemical method must be used in order to obtain a confirmed analytical result. Gas chromatography / mass spectrometry (GC/MS) is the preferred confirmat ory  method. Performed at [facility], [address] [place], [place] [address]   Comprehensive metabolic panel     Status: Abnormal   Collection Time: 07/17/18  4:02 PM  Result Value Ref Range   Sodium 138 135 - 145 mmol/L   Potassium 3.9 3.5 - 5.1 mmol/L   Chloride 99 98 - 111 mmol/L   CO2 29 22 - 32 mmol/L   Glucose, Bld 130 (H) 70 - 99 mg/dL   BUN 23 8 - 23 mg/dL   Creatinine, Ser 1.14 0.61 - 1.24 mg/dL   Calcium 8.8 (L) 8.9 - 10.3 mg/dL   Total Protein 6.8 6.5 - 8.1 g/dL   Albumin 4.4 3.5 - 5.0 g/dL   AST 24 15 - 41 U/L   ALT 14 0 - 44 U/L   Alkaline Phosphatase 49 38 - 126 U/L   Total Bilirubin 1.0 0.3 - 1.2 mg/dL   GFR calc non Af Amer >60 >60 mL/min   GFR calc Af Amer >60 >60 mL/min    Comment: (NOTE) The eGFR has been calculated using the CKD EPI equation. This calculation has not been validated in all clinical situations. eGFR's persistently <60 mL/min signify possible Chronic Kidney Disease.    Anion gap 10 5 - 15    Comment: Performed at [facility], [address] [place], [place] [address]  Ethanol     Status: None   Collection Time: 07/17/18  4:02 PM  Result Value Ref Range   Alcohol, Ethyl (B) <10 <10 mg/dL    Comment: (NOTE) Lowest detectable limit for serum alcohol is 10 mg/dL. For medical purposes only. Performed at [facility], [address] [place], [place] [address]   cbc     Status: Abnormal   Collection Time: 07/17/18  4:02 PM  Result Value Ref Range   WBC 9.4 3.8 - 10.6 K/uL   RBC 5.26 4.40 - 5.90 MIL/uL   Hemoglobin 14.2 13.0 - 18.0 g/dL   HCT 44.3 40.0 - 52.0 %   MCV 84.1 80.0 - 100.0 fL   MCH 27.0 26.0 - 34.0 pg   MCHC 32.1 32.0 - 36.0 g/dL   RDW 14.9 (H) 11.5 - 14.5 %  Platelets 242 150 - 440 K/uL    Comment: Performed at [facility], [address] [place], [place] [address]    Current Facility-Administered Medications  Medication Dose Route Frequency Provider Last Rate Last Dose  .  albuterol (PROVENTIL HFA;VENTOLIN HFA) 108 (90 Base) MCG/ACT inhaler 2 puff  2 puff Inhalation Q6H PRN Clapacs, John T, MD      . amLODipine (NORVASC) tablet 5 mg  5 mg Oral Daily Clapacs, John T, MD      . lidocaine (XYLOCAINE) 2 % jelly 1 application  1 application Urethral Once Stoioff, Scott C, MD      . loratadine (CLARITIN) tablet 10 mg  10 mg Oral Daily Clapacs, John T, MD      . metoprolol succinate (TOPROL-XL) 24 hr tablet 25 mg  25 mg Oral Daily Clapacs, John T, MD      . mometasone-formoterol (DULERA) 200-5 MCG/ACT inhaler 2 puff  2 puff Inhalation BID Clapacs, John T, MD      . pantoprazole (PROTONIX) EC tablet 40 mg  40 mg Oral Daily Clapacs, John T, MD      . roflumilast (DALIRESP) tablet 500 mcg  500 mcg Oral Daily Clapacs, John T, MD      . tamsulosin (FLOMAX) capsule 0.4 mg  0.4 mg Oral Daily Lavonia Drafts, MD   0.4 mg at 07/18/18 1205  . tiotropium (SPIRIVA) inhalation capsule 18 mcg  18 mcg Inhalation Daily Clapacs, Madie Reno, MD       Current Outpatient Medications  Medication Sig Dispense Refill  . ADVAIR DISKUS 500-50 MCG/DOSE AEPB INHALE 1 DOSE BY MOUTH TWICE A DAY (Patient taking differently: Inhale 1 puff into the lungs 2 (two) times daily. ) 1 each 2  . albuterol (VENTOLIN HFA) 108 (90 Base) MCG/ACT inhaler Inhale 2 puffs into the lungs every 6 (six) hours as needed for wheezing or shortness of breath. 1 Inhaler 1  . ALL DAY ALLERGY 10 MG tablet take 1 tablet by mouth once daily 30 tablet 7  . amLODipine (NORVASC) 5 MG tablet Take 5 mg by mouth daily. for high blood pressure  0  . DALIRESP 500 MCG TABS tablet take 1 tablet by mouth once daily 30 tablet 2  . fluticasone (FLONASE) 50 MCG/ACT nasal spray Place into both nostrils 2 (two) times daily.    . meloxicam (MOBIC) 15 MG tablet Take 15 mg by mouth daily.  0  . metoprolol succinate (TOPROL-XL) 25 MG 24 hr tablet Take 25 mg by mouth daily.  0  . omeprazole (PRILOSEC) 20 MG capsule Take 20 mg by mouth daily.     .  OXYGEN Inhale 2.5 L into the lungs continuous.    . predniSONE (DELTASONE) 10 MG tablet Take 1 tablet (10 mg total) by mouth daily. 30 tablet 1  . SPIRIVA HANDIHALER 18 MCG inhalation capsule INHALE 2 PUFFS BY MOUTH ONCE DAILY (Patient taking differently: Place 18 mcg into inhaler and inhale daily. ) 30 capsule 0  . tamsulosin (FLOMAX) 0.4 MG CAPS capsule Take 1 capsule (0.4 mg total) by mouth daily after breakfast. AM 90 capsule 3  . predniSONE (DELTASONE) 10 MG tablet Take 1 tablet (10 mg total) by mouth daily. (Patient not taking: Reported on 06/18/2018) 30 tablet 0  . predniSONE (STERAPRED UNI-PAK 21 TAB) 10 MG (21) TBPK tablet 6 tabs day 1 and taper 10 mg a day - 6 days (Patient not taking: Reported on 06/18/2018) 21 tablet 0    Musculoskeletal: Strength &  Muscle Tone: decreased and atrophy Gait & Station: unsteady Patient leans: N/A  Psychiatric Specialty Exam: Physical Exam  Nursing note and vitals reviewed. Constitutional: He appears well-developed.  HENT:  Head: Normocephalic and atraumatic.  Eyes: Pupils are equal, round, and reactive to light. Conjunctivae are normal.  Neck: Normal range of motion.  Cardiovascular: Normal heart sounds.  Respiratory: Effort normal.  Patient is now wearing full-time oxygen nasal cannula  GI: Soft.  Musculoskeletal: Normal range of motion.  Neurological: He is alert.  Skin: Skin is warm and dry.  Psychiatric: He has a normal mood and affect. His speech is normal. He is slowed. Thought content is delusional. Cognition and memory are impaired. He expresses impulsivity. He expresses no homicidal and no suicidal ideation.    Review of Systems  Constitutional: Negative.   HENT: Negative.   Eyes: Negative.   Respiratory: Negative.   Cardiovascular: Negative.   Gastrointestinal: Negative.   Musculoskeletal: Negative.   Skin: Negative.   Neurological: Negative.   Psychiatric/Behavioral: Positive for hallucinations and substance abuse. Negative for  depression, memory loss and suicidal ideas. The patient is not nervous/anxious and does not have insomnia.     Blood pressure 121/85, pulse 94, temperature 98.8 F (37.1 C), temperature source Oral, resp. rate 17, height 6' 3"  (1.905 m), weight 56.7 kg, SpO2 96 %.Body mass index is 15.62 kg/m.  General Appearance: Casual  Eye Contact:  Good  Speech:  Slow  Volume:  Normal  Mood:  Euthymic  Affect:  Congruent  Thought Process:  Coherent  Orientation:  Full (Time, Place, and Person)  Thought Content:  Hallucinations: Visual  Suicidal Thoughts:  No  Homicidal Thoughts:  No  Memory:  Immediate;   Fair Recent;   Fair Remote;   Fair  Judgement:  Impaired  Insight:  Shallow  Psychomotor Activity:  Normal  Concentration:  Concentration: Fair  Recall:  [employer] of Knowledge:  Fair  Language:  Good  Akathisia:  No  Handed:  Right  AIMS (if indicated):     Assets:  Desire for Improvement Housing Social Support  ADL's:  Impaired  Cognition:  Impaired,  Mild  Sleep:        Treatment Plan Summary: Plan 67 year old gentleman with probably a mild degree of dementia exacerbated by his hypoxia and chronic medical conditions and much worsened by his cocaine use.  Patient continues to believe that there were people sneaking into his house although he now wishes to explain that he thinks it probably was not the best idea to wave a pistol at them.  Patient has otherwise been calm and pretty much appropriate.  Given the circumstances and the fact that he is living independently and that his persistent belief in the visual hallucinations I still think it is most appropriate to refer him to geriatric psychiatry.  I understand we do not have 100% of the replies yet.  Spoke with nursing.  Restarted his usual outpatient medicine.  Told patient that he may still be waiting a little while longer before we have a disposition.  Continue current treatment plan.  Disposition: Recommend psychiatric Inpatient  admission when medically cleared.  Alethia Berthold, MD 07/18/2018 2:25 PM

## 2018-07-18 NOTE — BH Assessment (Signed)
Pt referral information faxed to:  [facility]   [address] [place], [place] [address] Phone: [phone] Fax: [phone] [facility]. [facility]   [address] [place] [address] Phone: [phone] Fax: [phone] [facility]   [address], [place] [address] Phone: [phone] Fax: [phone] [facility]   [address] [place], [place] [address] phone: [phone] Fax: [phone] [facility]    [address] [place] [address] Phone: [phone] Fax: [phone] [facility] [address] [place], [place] [address] Phone:[phone] Fax: [phone]

## 2018-07-18 NOTE — BH Assessment (Signed)
Writer followed up with referrals:   [facility] ([phone] or [phone]), Denied due to substance use.   [facility] (Carolyn-[phone]), denied due to need for detox    [facility] (Annie-[phone]), Declined due to medical concerns   [facility] (Cedric-[phone]), Pending Review   [facility] ([phone] or [phone]), pending review.    [facility] [phone]), no out of system beds.   [facility] ([place]-[phone] or [phone]), denied to medical acuity and cocaine use.

## 2018-07-18 NOTE — ED Notes (Signed)
Pt requesting to be released from the hospital. "I have personal matters to take care of. I'm not a danger to myself or anyone else."  RN explained to pt the doctor has decided he needs inpatient treatment. Pt asking to speak with Dr. Toni Amend.   Maintained on 15 minute checks and observation by security camera for safety.

## 2018-07-19 DIAGNOSIS — F29 Unspecified psychosis not due to a substance or known physiological condition: Secondary | ICD-10-CM | POA: Diagnosis not present

## 2018-07-19 DIAGNOSIS — F23 Brief psychotic disorder: Secondary | ICD-10-CM | POA: Diagnosis not present

## 2018-07-19 NOTE — ED Notes (Signed)
Pt observed lying in bed - watching TV   Pt visualized with NAD  Introduced myself to him  Plan of care discussed including referral to geripsych hospital   No verbalized needs or concerns at this time  Continue to monitor

## 2018-07-19 NOTE — ED Notes (Signed)
ED  Is the patient under IVC or is there intent for IVC: Yes.   Is the patient medically cleared: Yes.   Is there vacancy in the ED BHU: Yes.   Is the population mix appropriate for patient: Yes.   - pt wears o2 chronically at 2.5 L Is the patient awaiting placement in inpatient or outpatient setting: Yes.  geripsych placement  Has the patient had a psychiatric consult: Yes.   Survey of unit performed for contraband, proper placement and condition of furniture, tampering with fixtures in bathroom, shower, and each patient room: Yes.  ; Findings:  APPEARANCE/BEHAVIOR Calm and cooperative NEURO ASSESSMENT Orientation: oriented x3  Denies pain Hallucinations: No.None noted (Hallucinations)  Denies  Speech: Normal Gait: normal RESPIRATORY ASSESSMENT Even  Unlabored respirations  CARDIOVASCULAR ASSESSMENT Pulses equal   regular rate  Skin warm and dry   GASTROINTESTINAL ASSESSMENT no GI complaint EXTREMITIES Full ROM  PLAN OF CARE Provide calm/safe environment. Vital signs assessed twice daily. ED BHU Assessment once each 12-hour shift. Collaborate with TTS daily or as condition indicates. Assure the ED provider has rounded once each shift. Provide and encourage hygiene. Provide redirection as needed. Assess for escalating behavior; address immediately and inform ED provider.  Assess family dynamic and appropriateness for visitation as needed: Yes.  ; If necessary, describe findings:  Educate the patient/family about BHU procedures/visitation: Yes.  ; If necessary, describe findings:

## 2018-07-19 NOTE — ED Provider Notes (Signed)
Patient was cleared by Dr. Toni Amend for discharge.  Family is agreeable to coming in taking her home.   Emily Filbert, MD 07/19/18 [phone]

## 2018-07-19 NOTE — ED Provider Notes (Signed)
-----------------------------------------   6:48 AM on 07/19/2018 -----------------------------------------   Blood pressure 127/76, pulse 79, temperature 98.8 F (37.1 C), temperature source Oral, resp. rate 18, height 6\' 3"  (1.905 m), weight 56.7 kg, SpO2 98 %.  The patient had no acute events since last update.  Calm and cooperative at this time.  Disposition is pending Psychiatry/Behavioral Medicine team recommendations.     Irean Hong, MD 07/19/18 [phone]

## 2018-07-19 NOTE — ED Notes (Signed)
Pt. Resting in bed watching tv.  Pt. Calm and cooperative.  Pt. Inquiring when he would be leaving hospital.  Pt. Told we are still looking for home but we would inform him when facility is found.

## 2018-07-19 NOTE — Consult Note (Signed)
[facility] Face-to-Face Psychiatry Consult   Reason for Consult: Patient seen for follow-up.  We attempted to get him admitted to a geriatric psychiatry unit for a few days but no place is going to take him.  Meanwhile the patient's behavior has been calm and appropriate in the emergency room.  He has not shown any signs of psychosis and has not been agitated or threatening. Referring Physician: Mayford Knife Patient Identification: Frank Todd. MRN:  [id number] Principal Diagnosis: Psychosis [facility]) Diagnosis:   Patient Active Problem List   Diagnosis Date Noted  . Psychosis (HCC) [F29] 07/17/2018  . Cocaine abuse (HCC) [F14.10] 07/17/2018  . Violent behavior [R45.6] 07/17/2018  . Acute respiratory failure (HCC) [J96.00] 03/31/2018  . Microscopic hematuria [R31.29] 12/19/2017  . Pneumonia [J18.9] 11/26/2017  . Chronic respiratory failure with hypoxia (HCC) [J96.11] 11/26/2017  . Candidal stomatitis [B37.0] 11/26/2017  . Atelectasis [J98.11] 11/26/2017  . Solitary pulmonary nodule [R91.1] 11/26/2017  . 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 Chronic obstructive pulmonary disease with acute exacerbation (HCC) [J44.1] 11/26/2017  . 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 Hemoptysis [R04.2] 11/26/2017  . Shortness of breath [R06.02] 11/26/2017  . Hypertension, essential [I10] 11/26/2017  . Benign prostatic hyperplasia with lower urinary tract symptoms [N40.1] 03/12/2014    Total Time spent with patient: 30 minutes  Subjective:   Frank Todd. is a 67 y.o. male patient admitted with "I am feeling better".  HPI: As noted above the patient has not had any behavior problems in the emergency room.  He still vaguely believes that people were breaking into his house but also is able to consider the possibility that it was a hallucination.  He indicates no intention of hurting anyone and agrees that he should not have a gun at home and that he will not try to  threaten people in the future.  Affect is euthymic no sign of mania no sign of suicidality.  At this point there is seems to be no further benefit to keeping him in the hospital particularly as we would be unable to admit him to any psychiatric ward.  Patient appears to be stable and safe and no longer meeting commitment criteria.  Past Psychiatric History: No previous history  Risk to Self: Suicidal Ideation: No Suicidal Intent: No Is patient at risk for suicide?: No Suicidal Plan?: No Access to Means: No What has been your use of drugs/alcohol within the last 12 months?: Cocaine & Alcohol How many times?: 0 Other Self Harm Risks: Active Addiction Triggers for Past Attempts: None known Intentional Self Injurious Behavior: None Risk to Others: Homicidal Ideation: No Thoughts of Harm to Others: No Current Homicidal Intent: No Current Homicidal Plan: No Access to Homicidal Means: No Identified Victim: Reports of none History of harm to others?: No Assessment of Violence: None Noted Violent Behavior Description: Reports of none Does patient have access to weapons?: No Criminal Charges Pending?: No Does patient have a court date: No Prior Inpatient Therapy: Prior Inpatient Therapy: Yes Prior Therapy Dates: 2016 Prior Therapy Facilty/Provider(s): Detox Facility Reason for Treatment: SA Treatment Prior Outpatient Therapy: Prior Outpatient Therapy: No Does patient have an ACCT team?: No Does patient have Intensive In-House Services?  : No Does patient have [facility] services? : No Does patient have P4CC services?: No  Past Medical History:  Past Medical History:  Diagnosis Date  . Arthritis    shoulders  . Asthma   . Benign prostatic hypertrophy   . COPD (  chronic obstructive pulmonary disease) (HCC)    chronic bronchitis  . GERD (gastroesophageal reflux disease)   . Hypertension   . Seasonal allergies   . Shortness of breath dyspnea   . Wears partial dentures    upper     Past Surgical History:  Procedure Laterality Date  . CATARACT EXTRACTION W/PHACO Right 03/30/2015   Procedure: CATARACT EXTRACTION PHACO AND INTRAOCULAR LENS PLACEMENT (IOC);  Surgeon: Lockie Mola, MD;  Location[facility];  Service: Ophthalmology;  Laterality: Right;  . CIRCUMCISION     as child  . DENTAL SURGERY     Family History: History reviewed. No pertinent family history. Family Psychiatric  History: See previous notes Social History:  Social History   Substance and Sexual Activity  Alcohol Use Yes  . Alcohol/week: 3.0 standard drinks  . Types: 3 Shots of liquor per week   Comment: pt said "several pints per week"     Social History   Substance and Sexual Activity  Drug Use No    Social History   Socioeconomic History  . Marital status: Divorced    Spouse name: Not on file  . Number of children: Not on file  . Years of education: Not on file  . Highest education level: Not on file  Occupational History  . Not on file  Social Needs  . Financial resource strain: Not on file  . Food insecurity:    Worry: Not on file    Inability: Not on file  . Transportation needs:    Medical: Not on file    Non-medical: Not on file  Tobacco Use  . Smoking status: Former Smoker    Last attempt to quit: 11/12/1984    Years since quitting: 33.7  . Smokeless tobacco: Never Used  Substance and Sexual Activity  . Alcohol use: Yes    Alcohol/week: 3.0 standard drinks    Types: 3 Shots of liquor per week    Comment: pt said "several pints per week"  . Drug use: No  . Sexual activity: Not Currently  Lifestyle  . Physical activity:    Days per week: Not on file    Minutes per session: Not on file  . Stress: Not on file  Relationships  . Social connections:    Talks on phone: Not on file    Gets together: Not on file    Attends religious service: Not on file    Active member of club or organization: Not on file    Attends meetings of clubs or  organizations: Not on file    Relationship status: Not on file  Other Topics Concern  . Not on file  Social History Narrative  . Not on file   Additional Social History:    Allergies:  No Known Allergies  Labs: No results found for this or any previous visit (from the past 48 hour(s)).  Current Facility-Administered Medications  Medication Dose Route Frequency Provider Last Rate Last Dose  . albuterol (PROVENTIL) (2.5 MG/3ML) 0.083% nebulizer solution 2.5 mg  2.5 mg Inhalation Q6H PRN Jazalyn Mondor T, MD      . amLODipine (NORVASC) tablet 5 mg  5 mg Oral Daily Sanja Elizardo, Jackquline Denmark, MD   5 mg at 07/19/18 0856  . lidocaine (XYLOCAINE) 2 % jelly 1 application  1 application Urethral Once Stoioff, Scott C, MD      . loratadine (CLARITIN) tablet 10 mg  10 mg Oral Daily Candid Bovey, Jackquline Denmark, MD   10 mg at  07/19/18 0853  . metoprolol succinate (TOPROL-XL) 24 hr tablet 25 mg  25 mg Oral Daily Yan Pankratz, Jackquline Denmark, MD   25 mg at 07/19/18 0855  . mometasone-formoterol (DULERA) 200-5 MCG/ACT inhaler 2 puff  2 puff Inhalation BID Nickalus Thornsberry, Jackquline Denmark, MD   2 puff at 07/19/18 [phone]  . pantoprazole (PROTONIX) EC tablet 40 mg  40 mg Oral Daily Bao Bazen, Jackquline Denmark, MD   40 mg at 07/19/18 0853  . roflumilast (DALIRESP) tablet 500 mcg  500 mcg Oral Daily Ariatna Jester, Jackquline Denmark, MD   500 mcg at 07/19/18 0857  . tamsulosin (FLOMAX) capsule 0.4 mg  0.4 mg Oral Daily Jene Every, MD   0.4 mg at 07/19/18 0855  . tiotropium (SPIRIVA) inhalation capsule 18 mcg  18 mcg Inhalation Daily Bertha Earwood, Jackquline Denmark, MD   18 mcg at 07/19/18 [id number]   Current Outpatient Medications  Medication Sig Dispense Refill  . ADVAIR DISKUS 500-50 MCG/DOSE AEPB INHALE 1 DOSE BY MOUTH TWICE A DAY (Patient taking differently: Inhale 1 puff into the lungs 2 (two) times daily. ) 1 each 2  . albuterol (VENTOLIN HFA) 108 (90 Base) MCG/ACT inhaler Inhale 2 puffs into the lungs every 6 (six) hours as needed for wheezing or shortness of breath. 1 Inhaler 1  . ALL DAY ALLERGY 10  MG tablet take 1 tablet by mouth once daily 30 tablet 7  . amLODipine (NORVASC) 5 MG tablet Take 5 mg by mouth daily. for high blood pressure  0  . DALIRESP 500 MCG TABS tablet take 1 tablet by mouth once daily 30 tablet 2  . fluticasone (FLONASE) 50 MCG/ACT nasal spray Place into both nostrils 2 (two) times daily.    . meloxicam (MOBIC) 15 MG tablet Take 15 mg by mouth daily.  0  . metoprolol succinate (TOPROL-XL) 25 MG 24 hr tablet Take 25 mg by mouth daily.  0  . omeprazole (PRILOSEC) 20 MG capsule Take 20 mg by mouth daily.     . OXYGEN Inhale 2.5 L into the lungs continuous.    . predniSONE (DELTASONE) 10 MG tablet Take 1 tablet (10 mg total) by mouth daily. 30 tablet 1  . SPIRIVA HANDIHALER 18 MCG inhalation capsule INHALE 2 PUFFS BY MOUTH ONCE DAILY (Patient taking differently: Place 18 mcg into inhaler and inhale daily. ) 30 capsule 0  . tamsulosin (FLOMAX) 0.4 MG CAPS capsule Take 1 capsule (0.4 mg total) by mouth daily after breakfast. AM 90 capsule 3  . predniSONE (DELTASONE) 10 MG tablet Take 1 tablet (10 mg total) by mouth daily. (Patient not taking: Reported on 06/18/2018) 30 tablet 0  . predniSONE (STERAPRED UNI-PAK 21 TAB) 10 MG (21) TBPK tablet 6 tabs day 1 and taper 10 mg a day - 6 days (Patient not taking: Reported on 06/18/2018) 21 tablet 0    Musculoskeletal: Strength & Muscle Tone: decreased Gait & Station: normal Patient leans: N/A  Psychiatric Specialty Exam: Physical Exam  Nursing note and vitals reviewed. Constitutional: He appears well-developed and well-nourished.  HENT:  Head: Normocephalic and atraumatic.  Eyes: Pupils are equal, round, and reactive to light. Conjunctivae are normal.  Neck: Normal range of motion.  Cardiovascular: Regular rhythm and normal heart sounds.  Respiratory: Effort normal. No respiratory distress.  GI: Soft.  Musculoskeletal: Normal range of motion.  Neurological: He is alert.  Skin: Skin is warm and dry.  Psychiatric: He has a  normal mood and affect. His speech is normal and behavior is normal. Judgment normal.  His affect is not blunt. Thought content is not paranoid. He expresses no homicidal and no suicidal ideation. He exhibits abnormal recent memory.    Review of Systems  Constitutional: Negative.   HENT: Negative.   Eyes: Negative.   Respiratory: Negative.   Cardiovascular: Negative.   Gastrointestinal: Negative.   Musculoskeletal: Negative.   Skin: Negative.   Neurological: Negative.   Psychiatric/Behavioral: Negative.     Blood pressure 110/68, pulse 69, temperature 98.3 F (36.8 C), resp. rate 17, height 6\' 3"  (1.905 m), weight 56.7 kg, SpO2 99 %.Body mass index is 15.62 kg/m.  General Appearance: Casual  Eye Contact:  Good  Speech:  Clear and Coherent  Volume:  Normal  Mood:  Euthymic  Affect:  Congruent  Thought Process:  Goal Directed  Orientation:  Full (Time, Place, and Person)  Thought Content:  Logical  Suicidal Thoughts:  No  Homicidal Thoughts:  No  Memory:  Immediate;   Fair Recent;   Fair Remote;   Fair  Judgement:  Fair  Insight:  Fair  Psychomotor Activity:  Normal  Concentration:  Concentration: Fair  Recall:  [employer] of Knowledge:  Fair  Language:  Fair  Akathisia:  No  Handed:  Right  AIMS (if indicated):     Assets:  Desire for Improvement Housing Resilience Social Support  ADL's:  Intact  Cognition:  Impaired,  Mild  Sleep:        Treatment Plan Summary: Plan Patient no longer showing psychotic symptoms.  Patient most likely was having a reaction to cocaine abuse.  Psychoeducation completed about the dangers of cocaine abuse and the patient agrees to not do it anymore.  He will be given information about referral to local mental health providers.  Case reviewed with TTS and emergency room doctor.  No longer meets commitment criteria so I have discontinue the IV C and he can be discharged home.  Sister has been contacted and agrees to try to get all of his  guns out of the house.  Disposition: Patient does not meet criteria for psychiatric inpatient admission. Supportive therapy provided about ongoing stressors.  Mordecai Rasmussen, MD 07/19/2018 5:34 PM

## 2018-07-19 NOTE — ED Notes (Signed)
BEHAVIORAL HEALTH ROUNDING Patient sleeping: No. Patient alert and oriented: yes Behavior appropriate: Yes.  ; If no, describe:  Nutrition and fluids offered: yes Toileting and hygiene offered: Yes  Sitter present: q15 minute observations and security  monitoring Law enforcement present: Yes  ODS  

## 2018-07-19 NOTE — ED Notes (Signed)

## 2018-07-19 NOTE — BH Assessment (Signed)
This Clinical research associate spoke with pt's sister who confirmed she "will look through the house and double check to make sure all guns have been removed" from pt's home. She also states she will pick up pt at discharge [phone]).

## 2018-07-21 ENCOUNTER — Encounter: Payer: Self-pay | Admitting: Internal Medicine

## 2018-07-21 ENCOUNTER — Ambulatory Visit (INDEPENDENT_AMBULATORY_CARE_PROVIDER_SITE_OTHER): Payer: Medicare Other | Admitting: Internal Medicine

## 2018-07-21 VITALS — BP 118/78 | HR 84 | Temp 98.5°F | Resp 16 | Ht 75.0 in | Wt 123.0 lb

## 2018-07-21 DIAGNOSIS — R0602 Shortness of breath: Secondary | ICD-10-CM | POA: Diagnosis not present

## 2018-07-21 DIAGNOSIS — J449 Chronic obstructive pulmonary disease, unspecified: Secondary | ICD-10-CM | POA: Diagnosis not present

## 2018-07-21 DIAGNOSIS — F1410 Cocaine abuse, uncomplicated: Secondary | ICD-10-CM

## 2018-07-21 NOTE — Progress Notes (Signed)
[facility] [place], [place] [address]  Pulmonary Sleep Medicine   Office Visit Note  Patient Name: Frank Todd. DOB: [birth year] MRN [id number]  Date of Service: 07/21/2018  Complaints/HPI: Frank Todd presented to the office today because he has been having increasing shortness of breath.  Initially I was thinking that this was basically his severe COPD getting worse however we during our discussion were able to disclose that he is also using cocaine and smoking it and it is possibly mixed with other drugs.  The patient has severe COPD in the last time that he was in the office he had an FEV1 of 0.9 L which of course is very severe and he states he does not smoke cigarettes however he has been using drugs.  His sister was in the room and she indicated that he has never really quit drugs and he is continued to use cocaine.  He was recently in the hospital emergency department it appears on 7 September was seen by psychiatry because the patient had exhibited aggressive behavior.  He sister states that he had apparently gun which she states has now been taken by the police.  I asked him specifically if he was suicidal he stated no sister stated no and I asked him specifically if he was homicidal and he stated no.  The patient and sister indicated that they did want some help and I made a phone call to psychiatry and I was advised that he could either go to the emergency room and I gave them this option and they did not want to go to the ER.  Also gave them the option to be admitted to detox which is I think what he really needs and she states that they were in a detox setting.  He was cleared for discharge on the seventh and the sister took him home.  Now she wants more help.  I explained to her that this is basically a very complicated case and I was able to make some recommendations based on the information that I had available regarding his breathing and I think that his  breathing is basically getting worse because of the ongoing drug use.  The patient was referred to [facility] drug treatment center after I consulted with the psychiatrist and I gave them the address and phone number and instructed them to proceed to the facility as a walk-in patient.  In addition the list of local behavioral health offices was also given to the patient.  The sister reassured me that there are no guns in the house and the police had already taken the guns away from the house and it was secured.  ROS  General: (-) fever, (-) chills, (-) night sweats, (-) weakness Skin: (-) rashes, (-) itching,. Eyes: (-) visual changes, (-) redness, (-) itching. Nose and Sinuses: (-) nasal stuffiness or itchiness, (-) postnasal drip, (-) nosebleeds, (-) sinus trouble. Mouth and Throat: (-) sore throat, (-) hoarseness. Neck: (-) swollen glands, (-) enlarged thyroid, (-) neck pain. Respiratory: + cough, (-) bloody sputum, + shortness of breath, + wheezing. Cardiovascular: ++ ankle swelling, (-) chest pain. Lymphatic: (-) lymph node enlargement. Neurologic: (-) numbness, (-) tingling. Psychiatric: (-) anxiety, (-) depression   Current Medication: Outpatient Encounter Medications as of 07/21/2018  Medication Sig  . ADVAIR DISKUS 500-50 MCG/DOSE AEPB INHALE 1 DOSE BY MOUTH TWICE A DAY (Patient taking differently: Inhale 1 puff into the lungs 2 (two) times daily. )  .  albuterol (VENTOLIN HFA) 108 (90 Base) MCG/ACT inhaler Inhale 2 puffs into the lungs every 6 (six) hours as needed for wheezing or shortness of breath.  . ALL DAY ALLERGY 10 MG tablet take 1 tablet by mouth once daily  . amLODipine (NORVASC) 5 MG tablet Take 5 mg by mouth daily. for high blood pressure  . DALIRESP 500 MCG TABS tablet take 1 tablet by mouth once daily  . fluticasone (FLONASE) 50 MCG/ACT nasal spray Place into both nostrils 2 (two) times daily.  . meloxicam (MOBIC) 15 MG tablet Take 15 mg by mouth daily.  . metoprolol  succinate (TOPROL-XL) 25 MG 24 hr tablet Take 25 mg by mouth daily.  Marland Kitchen omeprazole (PRILOSEC) 20 MG capsule Take 20 mg by mouth daily.   . OXYGEN Inhale 2.5 L into the lungs continuous.  . predniSONE (DELTASONE) 10 MG tablet Take 1 tablet (10 mg total) by mouth daily.  . predniSONE (DELTASONE) 10 MG tablet Take 1 tablet (10 mg total) by mouth daily.  . predniSONE (STERAPRED UNI-PAK 21 TAB) 10 MG (21) TBPK tablet 6 tabs day 1 and taper 10 mg a day - 6 days  . SPIRIVA HANDIHALER 18 MCG inhalation capsule INHALE 2 PUFFS BY MOUTH ONCE DAILY (Patient taking differently: Place 18 mcg into inhaler and inhale daily. )  . tamsulosin (FLOMAX) 0.4 MG CAPS capsule Take 1 capsule (0.4 mg total) by mouth daily after breakfast. AM   Facility-Administered Encounter Medications as of 07/21/2018  Medication  . lidocaine (XYLOCAINE) 2 % jelly 1 application    Surgical History: Past Surgical History:  Procedure Laterality Date  . CATARACT EXTRACTION W/PHACO Right 03/30/2015   Procedure: CATARACT EXTRACTION PHACO AND INTRAOCULAR LENS PLACEMENT (IOC);  Surgeon: Leandrew Koyanagi, MD;  Location: [facility];  Service: Ophthalmology;  Laterality: Right;  . CIRCUMCISION     as child  . DENTAL SURGERY      Medical History: Past Medical History:  Diagnosis Date  . Arthritis    shoulders  . Asthma   . Benign prostatic hypertrophy   . COPD (chronic obstructive pulmonary disease) (HCC)    chronic bronchitis  . GERD (gastroesophageal reflux disease)   . Hypertension   . Seasonal allergies   . Shortness of breath dyspnea   . Wears partial dentures    upper    Family History: No family history on file.  Social History: Social History   Socioeconomic History  . Marital status: Divorced    Spouse name: Not on file  . Number of children: Not on file  . Years of education: Not on file  . Highest education level: Not on file  Occupational History  . Not on file  Social Needs  . Financial  resource strain: Not on file  . Food insecurity:    Worry: Not on file    Inability: Not on file  . Transportation needs:    Medical: Not on file    Non-medical: Not on file  Tobacco Use  . Smoking status: Former Smoker    Last attempt to quit: 11/12/1984    Years since quitting: 33.7  . Smokeless tobacco: Never Used  Substance and Sexual Activity  . Alcohol use: Yes    Alcohol/week: 3.0 standard drinks    Types: 3 Shots of liquor per week    Comment: pt said "several pints per week"  . Drug use: No  . Sexual activity: Not Currently  Lifestyle  . Physical activity:    Days per  week: Not on file    Minutes per session: Not on file  . Stress: Not on file  Relationships  . Social connections:    Talks on phone: Not on file    Gets together: Not on file    Attends religious service: Not on file    Active member of club or organization: Not on file    Attends meetings of clubs or organizations: Not on file    Relationship status: Not on file  . Intimate partner violence:    Fear of current or ex partner: Not on file    Emotionally abused: Not on file    Physically abused: Not on file    Forced sexual activity: Not on file  Other Topics Concern  . Not on file  Social History Narrative  . Not on file    Vital Signs: Blood pressure 118/78, pulse 84, temperature 98.5 F (36.9 C), resp. rate 16, height 6' 3"  (1.905 m), weight 123 lb (55.8 kg), SpO2 91 %.  Examination: General Appearance: The patient is well-developed, well-nourished, and in no distress. Skin: Gross inspection of skin unremarkable. Head: normocephalic, no gross deformities. Eyes: no gross deformities noted. ENT: ears appear grossly normal no exudates. Neck: Supple. No thyromegaly. No LAD. Respiratory: Coarse distant rhonchi are noted. Cardiovascular: Normal S1 and S2 without murmur or rub. Extremities: No cyanosis. pulses are equal. Neurologic: Alert and oriented. No involuntary movements.  LABS: Recent  Results (from the past 2160 hour(s))  Urinalysis, Complete w Microscopic     Status: Abnormal   Collection Time: 06/06/18 11:34 AM  Result Value Ref Range   Color, Urine YELLOW (A) YELLOW   APPearance CLEAR (A) CLEAR   Specific Gravity, Urine 1.005 1.005 - 1.030   pH 6.0 5.0 - 8.0   Glucose, UA NEGATIVE NEGATIVE mg/dL   Hgb urine dipstick NEGATIVE NEGATIVE   Bilirubin Urine NEGATIVE NEGATIVE   Ketones, ur NEGATIVE NEGATIVE mg/dL   Protein, ur NEGATIVE NEGATIVE mg/dL   Nitrite NEGATIVE NEGATIVE   Leukocytes, UA NEGATIVE NEGATIVE   RBC / HPF 0-5 0 - 5 RBC/hpf   WBC, UA 0-5 0 - 5 WBC/hpf   Bacteria, UA NONE SEEN NONE SEEN   Squamous Epithelial / LPF NONE SEEN 0 - 5   Amorphous Crystal PRESENT     Comment: Performed at [facility], [address] [place], [place] [address]  Urinalysis, Complete w Microscopic     Status: Abnormal   Collection Time: 06/18/18 12:25 PM  Result Value Ref Range   Color, Urine YELLOW (A) YELLOW   APPearance CLOUDY (A) CLEAR   Specific Gravity, Urine 1.004 (L) 1.005 - 1.030   pH 8.0 5.0 - 8.0   Glucose, UA NEGATIVE NEGATIVE mg/dL   Hgb urine dipstick MODERATE (A) NEGATIVE   Bilirubin Urine NEGATIVE NEGATIVE   Ketones, ur NEGATIVE NEGATIVE mg/dL   Protein, ur NEGATIVE NEGATIVE mg/dL   Nitrite NEGATIVE NEGATIVE   Leukocytes, UA LARGE (A) NEGATIVE   RBC / HPF 0-5 0 - 5 RBC/hpf   WBC, UA >50 (H) 0 - 5 WBC/hpf   Bacteria, UA MANY (A) NONE SEEN   Squamous Epithelial / LPF 0-5 0 - 5   WBC Clumps PRESENT     Comment: Performed at [facility], [address] [place],  [address]  Basic metabolic panel     Status: Abnormal   Collection Time: 06/18/18 12:25 PM  Result Value Ref Range   Sodium 135 135 - 145  mmol/L   Potassium 3.4 (L) 3.5 - 5.1 mmol/L   Chloride 96 (L) 98 - 111 mmol/L   CO2 24 22 - 32 mmol/L   Glucose, Bld 70 70 - 99 mg/dL   BUN 16 8 - 23 mg/dL   Creatinine, Ser 1.28 (H) 0.61 - 1.24 mg/dL   Calcium 8.9 8.9 -  10.3 mg/dL   GFR calc non Af Amer 56 (L) >60 mL/min   GFR calc Af Amer >60 >60 mL/min    Comment: (NOTE) The eGFR has been calculated using the CKD EPI equation. This calculation has not been validated in all clinical situations. eGFR's persistently <60 mL/min signify possible Chronic Kidney Disease.    Anion gap 15 5 - 15    Comment: Performed at [facility], [address] [place], [place] [address]  CBC with Differential     Status: Abnormal   Collection Time: 06/18/18 12:25 PM  Result Value Ref Range   WBC 10.1 3.8 - 10.6 K/uL   RBC 4.65 4.40 - 5.90 MIL/uL   Hemoglobin 12.9 (L) 13.0 - 18.0 g/dL   HCT 39.7 (L) 40.0 - 52.0 %   MCV 85.3 80.0 - 100.0 fL   MCH 27.7 26.0 - 34.0 pg   MCHC 32.5 32.0 - 36.0 g/dL   RDW 14.9 (H) 11.5 - 14.5 %   Platelets 232 150 - 440 K/uL   Neutrophils Relative % 71 %   Neutro Abs 7.2 (H) 1.4 - 6.5 K/uL   Lymphocytes Relative 17 %   Lymphs Abs 1.7 1.0 - 3.6 K/uL   Monocytes Relative 10 %   Monocytes Absolute 1.0 0.2 - 1.0 K/uL   Eosinophils Relative 1 %   Eosinophils Absolute 0.1 0 - 0.7 K/uL   Basophils Relative 1 %   Basophils Absolute 0.1 0 - 0.1 K/uL    Comment: Performed at [facility], [address] [place], [place] [address]  Basic metabolic panel     Status: Abnormal   Collection Time: 06/30/18  5:22 PM  Result Value Ref Range   Sodium 136 135 - 145 mmol/L   Potassium 3.5 3.5 - 5.1 mmol/L   Chloride 101 98 - 111 mmol/L   CO2 30 22 - 32 mmol/L   Glucose, Bld 96 70 - 99 mg/dL   BUN 11 8 - 23 mg/dL   Creatinine, Ser 0.89 0.61 - 1.24 mg/dL   Calcium 8.6 (L) 8.9 - 10.3 mg/dL   GFR calc non Af Amer >60 >60 mL/min   GFR calc Af Amer >60 >60 mL/min    Comment: (NOTE) The eGFR has been calculated using the CKD EPI equation. This calculation has not been validated in all clinical situations. eGFR's persistently <60 mL/min signify possible Chronic Kidney Disease.    Anion gap 5 5 - 15    Comment: Performed at  [facility], [address] [place], [place] [address]  CBC     Status: Abnormal   Collection Time: 06/30/18  5:22 PM  Result Value Ref Range   WBC 7.0 3.8 - 10.6 K/uL   RBC 4.96 4.40 - 5.90 MIL/uL   Hemoglobin 13.5 13.0 - 18.0 g/dL   HCT 42.2 40.0 - 52.0 %   MCV 85.1 80.0 - 100.0 fL   MCH 27.3 26.0 - 34.0 pg   MCHC 32.0 32.0 - 36.0 g/dL   RDW 15.0 (H) 11.5 - 14.5 %   Platelets 276 150 - 440 K/uL    Comment: Performed  at [facility], [address] [place], [place] [address]  Troponin I     Status: None   Collection Time: 06/30/18  5:22 PM  Result Value Ref Range   Troponin I <0.03 <0.03 ng/mL    Comment: Performed at [facility], [address] [place], [place] [address]  Brain natriuretic peptide     Status: Abnormal   Collection Time: 06/30/18  5:27 PM  Result Value Ref Range   B Natriuretic Peptide 104.0 (H) 0.0 - 100.0 pg/mL    Comment: Performed at [facility], [address], [place], [place] [address]  Urine Drug Screen, Qualitative     Status: Abnormal   Collection Time: 07/17/18  4:01 PM  Result Value Ref Range   Tricyclic, Ur Screen NONE DETECTED NONE DETECTED   Amphetamines, Ur Screen NONE DETECTED NONE DETECTED   MDMA (Ecstasy)Ur Screen NONE DETECTED NONE DETECTED   Cocaine Metabolite,Ur [place] POSITIVE (A) NONE DETECTED   Opiate, Ur Screen NONE DETECTED NONE DETECTED   Phencyclidine (PCP) Ur S NONE DETECTED NONE DETECTED   Cannabinoid 50 Ng, Ur [place] NONE DETECTED NONE DETECTED   Barbiturates, Ur Screen NONE DETECTED NONE DETECTED   Benzodiazepine, Ur Scrn TEST NOT PERFORMED, REAGENT NOT AVAILABLE (A) NONE DETECTED   Methadone Scn, Ur NONE DETECTED NONE DETECTED    Comment: (NOTE) Tricyclics + metabolites, urine    Cutoff 1000 ng/mL Amphetamines + metabolites, urine  Cutoff 1000 ng/mL MDMA (Ecstasy), urine              Cutoff 500 ng/mL Cocaine Metabolite, urine          Cutoff 300 ng/mL Opiate + metabolites, urine         Cutoff 300 ng/mL Phencyclidine (PCP), urine         Cutoff 25 ng/mL Cannabinoid, urine                 Cutoff 50 ng/mL Barbiturates + metabolites, urine  Cutoff 200 ng/mL Benzodiazepine, urine              Cutoff 200 ng/mL Methadone, urine                   Cutoff 300 ng/mL The urine drug screen provides only a preliminary, unconfirmed analytical test result and should not be used for non-medical purposes. Clinical consideration and professional judgment should be applied to any positive drug screen result due to possible interfering substances. A more specific alternate chemical method must be used in order to obtain a confirmed analytical result. Gas chromatography / mass spectrometry (GC/MS) is the preferred confirmat ory method. Performed at [facility], [address] [place], [place] [address]   Comprehensive metabolic panel     Status: Abnormal   Collection Time: 07/17/18  4:02 PM  Result Value Ref Range   Sodium 138 135 - 145 mmol/L   Potassium 3.9 3.5 - 5.1 mmol/L   Chloride 99 98 - 111 mmol/L   CO2 29 22 - 32 mmol/L   Glucose, Bld 130 (H) 70 - 99 mg/dL   BUN 23 8 - 23 mg/dL   Creatinine, Ser 1.14 0.61 - 1.24 mg/dL   Calcium 8.8 (L) 8.9 - 10.3 mg/dL   Total Protein 6.8 6.5 - 8.1 g/dL   Albumin 4.4 3.5 - 5.0 g/dL   AST 24 15 - 41 U/L   ALT 14 0 - 44 U/L   Alkaline Phosphatase 49 38 - 126 U/L  Total Bilirubin 1.0 0.3 - 1.2 mg/dL   GFR calc non Af Amer >60 >60 mL/min   GFR calc Af Amer >60 >60 mL/min    Comment: (NOTE) The eGFR has been calculated using the CKD EPI equation. This calculation has not been validated in all clinical situations. eGFR's persistently <60 mL/min signify possible Chronic Kidney Disease.    Anion gap 10 5 - 15    Comment: Performed at [facility], [address] [place], [place] [address]  Ethanol     Status: None   Collection Time: 07/17/18  4:02 PM  Result Value Ref Range   Alcohol, Ethyl (B) <10 <10 mg/dL     Comment: (NOTE) Lowest detectable limit for serum alcohol is 10 mg/dL. For medical purposes only. Performed at [facility], [address] [place], [place] [address]   cbc     Status: Abnormal   Collection Time: 07/17/18  4:02 PM  Result Value Ref Range   WBC 9.4 3.8 - 10.6 K/uL   RBC 5.26 4.40 - 5.90 MIL/uL   Hemoglobin 14.2 13.0 - 18.0 g/dL   HCT 44.3 40.0 - 52.0 %   MCV 84.1 80.0 - 100.0 fL   MCH 27.0 26.0 - 34.0 pg   MCHC 32.1 32.0 - 36.0 g/dL   RDW 14.9 (H) 11.5 - 14.5 %   Platelets 242 150 - 440 K/uL    Comment: Performed at [facility], [address], [place], [place] [address]    Radiology: No results found.  No results found.  Dg Chest 2 View  Result Date: 06/30/2018 CLINICAL DATA:  Swollen ankles. History of hypertension, COPD, asthma. EXAM: CHEST - 2 VIEW COMPARISON:  Chest x-ray dated 03/31/2018. FINDINGS: Lungs are hyperexpanded, consistent with the given history of COPD. Associated chronic bronchitic changes centrally. No confluent opacity to suggest a developing pneumonia. No pulmonary edema, pleural effusion or pneumothorax seen. Heart size and mediastinal contours are stable. No acute or suspicious osseous findings. IMPRESSION: No active cardiopulmonary disease. No evidence of pneumonia or pulmonary edema. Electronically Signed   By: Franki Cabot M.D.   On: 06/30/2018 18:09      Assessment and Plan: Patient Active Problem List   Diagnosis Date Noted  . Psychosis ([place]) 07/17/2018  . Cocaine abuse ([place]) 07/17/2018  . Violent behavior 07/17/2018  . Acute respiratory failure ([place]) 03/31/2018  . Microscopic hematuria 12/19/2017  . Pneumonia 11/26/2017  . Chronic respiratory failure with hypoxia ([place]) 11/26/2017  . Candidal stomatitis 11/26/2017  . Atelectasis 11/26/2017  . Solitary pulmonary nodule 11/26/2017  . Panlobular emphysema ([place]) 11/26/2017  . Chronic obstructive pulmonary disease with acute exacerbation ([place]) 11/26/2017  .  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 Hemoptysis 11/26/2017  . Shortness of breath 11/26/2017  . Hypertension, essential 11/26/2017  . Benign prostatic hyperplasia with lower urinary tract symptoms 03/12/2014    1. Severe COPD basically the patient has terminal COPD which is being made worse by his ongoing drug habit.  Patient was instructed to stop using cocaine he needs to get into detox facility addresses and phone numbers and locations were given to the patient.  After I discussed with the psychiatrist I gave them the instructions and addresses.  Patient is currently on Advair which should be continued however I did warn him regarding using the cocaine as well as Advair.  I also warned him that at his age he is at high risk for cardiovascular complications from using cocaine.  Also  spoke to his sister and explained to her that if he continues to use these drugs he may be ending up killing himself inadvertently because of massive heart attack or other complications related to the drug use. 2. Shortness of breath secondary to above once again instructed to stop using drugs. 3. Cocaine abuse he needs to undergo detox facility locations and addresses were given along with phone numbers for him to go to get the help that he needs at this point.  Currently he is not suicidal and not homicidal not going to be injurious to himself.  While he is using the cocaine he says that he has had some hallucinations and he has been seeing things which would be consistent with someone who is under the influence of the drug itself.  It would still however be most beneficial for him to get detox and once again this was stressed to him at length as well as to the sister.  General Counseling: I have discussed the findings of the evaluation and examination with Frank Todd.  I have also discussed any further diagnostic evaluation thatmay be needed or ordered today. Frank Todd verbalizes understanding of the  findings of todays visit. We also reviewed his medications today and discussed drug interactions and side effects including but not limited excessive drowsiness and altered mental states. We also discussed that there is always a risk not just to him but also people around him. he has been encouraged to call the office with any questions or concerns that should arise related to todays visit.    Time spent: 60 minutes  I have personally obtained a history, examined the patient, evaluated laboratory and imaging results, formulated the assessment and plan and placed orders.    Allyne Gee, MD [facility] Pulmonary and Critical Care Sleep medicine

## 2018-07-21 NOTE — Patient Instructions (Signed)
Finding Treatment for Addiction What is addiction? Addiction is a complex disease of the brain. It causes an uncontrollable (compulsive) need for a substance. You can be addicted to alcohol, illegal drugs, or prescription medicines such as painkillers. Addiction can also be a behavior, like gambling or shopping. The need for the drug or activity can become so strong that you think about it all the time. You can also become physically dependent on a substance. Addiction can change the way your brain works. Because of these changes, getting more of whatever you are addicted to becomes the most important thing to you and feels better than other activities or relationships. Addiction can lead to changes in health, behavior, emotions, relationships, and choices that affect you and everyone around you. How do I know if I need treatment for addiction? Addiction is a progressive disease. Without treatment, addiction can get worse. Living with addiction puts you at higher risk for injury, poor health, lost employment, loss of money, and even death. You might need treatment for addiction if:  You have tried to stop or cut down, but you cannot.  Your addiction is causing physical health problems.  You find it annoying that your friends and family are concerned about your alcohol or substance use.  You feel guilty about substance abuse or a compulsive behavior.  You have lied or tried to hide your addiction.  You need a particular substance or activity to start your day or to calm down.  You are getting in trouble at school, work, home, or with the police.  You have done something illegal to support your addiction.  You are running out of money because of your addiction.  You have no time for anything other than your addiction.  What types of treatment are available? The treatment program that is right for you will depend on many factors, including the type of addiction you have. Treatment programs  can be outpatient or inpatient. In an outpatient program, you live at home and go to work or school, but you also go to a clinic for treatment. With an inpatient program, you live and sleep at the program facility during treatment. After treatment, you might need a plan for support during recovery. Other treatment options include:  Medicine. ? Some addictions may be treated with prescription medicines. ? You might also need medicine to treat anxiety or depression.  Counseling and behavior therapy. Therapy can help individuals and families behave in healthier ways and relate more effectively.  Support groups. Confidential group therapy, such as a 12-step program, can help individuals and families during treatment and recovery.  No single type of program is right for everyone. Many treatment programs involve a combination of education, counseling, and a 12-step, spiritually-based approach. Some treatment programs are government sponsored. They are geared for patients who do not have private insurance. Treatment programs can vary in many respects, such as:  Cost and types of insurance that are accepted.  Types of on-site medical services that are offered.  Length of stay, setting, and size.  Overall philosophy of treatment.  What should I consider when selecting a treatment program? It is important to think about your individual requirements when selecting a treatment program. There are a number of things to consider, such as:  If the program is certified by the appropriate government agency. Even private programs must be certified and employ certified professionals.  If the program is covered by your insurance. If finances are a concern, the first call you   should make is to your insurance company, if you have health insurance. Ask for a list of treatment programs that are in your network, and confirm any copayments and deductibles that you may have to pay. ? If you do not have insurance, or  if you choose to attend a program that does not accept your insurance, discuss whether a payment plan can be set up.  If treatment is available in languages other than English, if needed.  If the program offers detoxification treatment, if needed.  If 12-step meetings are held at the center or if transport is available for patients to attend meetings at other locations.  If the program is professional, organized, and clean.  If the program meets all of your needs, including physical and cultural needs.  If the facility offers specific treatment for your particular addiction.  If support continues to be offered after you have left the program.  If your treatment plan is continually looked at to make sure you are receiving the right treatment at the right time.  If mental health counseling is part of your treatment.  If medicine is included in treatment, if needed.  If your family is included in your treatment plan and if support is offered to them throughout the treatment process.  How the treatment works to prevent relapse.  Where else can I get help?  Your health care provider. Ask him or her to help you find addiction treatment. These discussions are confidential.  The National Council on Alcoholism and Drug Dependence (NCADD). This group has information about treatment centers and programs for people who have an addiction and for family members. ? The telephone number is 1-800-NCA-CALL (1-800-622-2255). ? The website is https://ncadd.org/about-ncadd/our-affiliates  The Substance Abuse and Mental Health Services Administration (SAMHSA). This group will help you find publicly funded treatment centers, help hotlines, and counseling services near you. ? The telephone number is 1-800-662-HELP (1-800-662-4357). ? The website is www.findtreatment.samhsa.gov In countries outside of the [place] and [place], look in local directories for contact information for services in your area. This  information is not intended to replace advice given to you by your health care provider. Make sure you discuss any questions you have with your health care provider. Document Released: 09/27/2005 Document Revised: 09/24/2016 Document Reviewed: 08/17/2014 Elsevier Interactive Patient Education  2017 Elsevier Inc.  

## 2018-07-24 ENCOUNTER — Other Ambulatory Visit: Payer: Self-pay | Admitting: Internal Medicine

## 2018-08-03 ENCOUNTER — Other Ambulatory Visit: Payer: Self-pay | Admitting: Internal Medicine

## 2018-08-08 ENCOUNTER — Encounter: Payer: Self-pay | Admitting: Internal Medicine

## 2018-08-08 NOTE — Progress Notes (Signed)
SCANNED IN 6 MIN WALK DONE ON 07/21/18.

## 2018-08-12 ENCOUNTER — Ambulatory Visit (INDEPENDENT_AMBULATORY_CARE_PROVIDER_SITE_OTHER): Payer: Medicare Other | Admitting: Adult Health

## 2018-08-12 ENCOUNTER — Encounter: Payer: Self-pay | Admitting: Adult Health

## 2018-08-12 VITALS — BP 126/68 | HR 109 | Resp 16 | Ht 74.5 in | Wt 127.8 lb

## 2018-08-12 DIAGNOSIS — J449 Chronic obstructive pulmonary disease, unspecified: Secondary | ICD-10-CM

## 2018-08-12 DIAGNOSIS — F1410 Cocaine abuse, uncomplicated: Secondary | ICD-10-CM

## 2018-08-12 DIAGNOSIS — R0602 Shortness of breath: Secondary | ICD-10-CM

## 2018-08-12 DIAGNOSIS — I10 Essential (primary) hypertension: Secondary | ICD-10-CM

## 2018-08-12 NOTE — Patient Instructions (Signed)
Finding Treatment for Addiction What is addiction? Addiction is a complex disease of the brain. It causes an uncontrollable (compulsive) need for a substance. You can be addicted to alcohol, illegal drugs, or prescription medicines such as painkillers. Addiction can also be a behavior, like gambling or shopping. The need for the drug or activity can become so strong that you think about it all the time. You can also become physically dependent on a substance. Addiction can change the way your brain works. Because of these changes, getting more of whatever you are addicted to becomes the most important thing to you and feels better than other activities or relationships. Addiction can lead to changes in health, behavior, emotions, relationships, and choices that affect you and everyone around you. How do I know if I need treatment for addiction? Addiction is a progressive disease. Without treatment, addiction can get worse. Living with addiction puts you at higher risk for injury, poor health, lost employment, loss of money, and even death. You might need treatment for addiction if:  You have tried to stop or cut down, but you cannot.  Your addiction is causing physical health problems.  You find it annoying that your friends and family are concerned about your alcohol or substance use.  You feel guilty about substance abuse or a compulsive behavior.  You have lied or tried to hide your addiction.  You need a particular substance or activity to start your day or to calm down.  You are getting in trouble at school, work, home, or with the police.  You have done something illegal to support your addiction.  You are running out of money because of your addiction.  You have no time for anything other than your addiction.  What types of treatment are available? The treatment program that is right for you will depend on many factors, including the type of addiction you have. Treatment programs  can be outpatient or inpatient. In an outpatient program, you live at home and go to work or school, but you also go to a clinic for treatment. With an inpatient program, you live and sleep at the program facility during treatment. After treatment, you might need a plan for support during recovery. Other treatment options include:  Medicine. ? Some addictions may be treated with prescription medicines. ? You might also need medicine to treat anxiety or depression.  Counseling and behavior therapy. Therapy can help individuals and families behave in healthier ways and relate more effectively.  Support groups. Confidential group therapy, such as a 12-step program, can help individuals and families during treatment and recovery.  No single type of program is right for everyone. Many treatment programs involve a combination of education, counseling, and a 12-step, spiritually-based approach. Some treatment programs are government sponsored. They are geared for patients who do not have private insurance. Treatment programs can vary in many respects, such as:  Cost and types of insurance that are accepted.  Types of on-site medical services that are offered.  Length of stay, setting, and size.  Overall philosophy of treatment.  What should I consider when selecting a treatment program? It is important to think about your individual requirements when selecting a treatment program. There are a number of things to consider, such as:  If the program is certified by the appropriate government agency. Even private programs must be certified and employ certified professionals.  If the program is covered by your insurance. If finances are a concern, the first call you   should make is to your insurance company, if you have health insurance. Ask for a list of treatment programs that are in your network, and confirm any copayments and deductibles that you may have to pay. ? If you do not have insurance, or  if you choose to attend a program that does not accept your insurance, discuss whether a payment plan can be set up.  If treatment is available in languages other than English, if needed.  If the program offers detoxification treatment, if needed.  If 12-step meetings are held at the center or if transport is available for patients to attend meetings at other locations.  If the program is professional, organized, and clean.  If the program meets all of your needs, including physical and cultural needs.  If the facility offers specific treatment for your particular addiction.  If support continues to be offered after you have left the program.  If your treatment plan is continually looked at to make sure you are receiving the right treatment at the right time.  If mental health counseling is part of your treatment.  If medicine is included in treatment, if needed.  If your family is included in your treatment plan and if support is offered to them throughout the treatment process.  How the treatment works to prevent relapse.  Where else can I get help?  Your health care provider. Ask him or her to help you find addiction treatment. These discussions are confidential.  The National Council on Alcoholism and Drug Dependence (NCADD). This group has information about treatment centers and programs for people who have an addiction and for family members. ? The telephone number is 1-800-NCA-CALL (1-800-622-2255). ? The website is https://ncadd.org/about-ncadd/our-affiliates  The Substance Abuse and Mental Health Services Administration (SAMHSA). This group will help you find publicly funded treatment centers, help hotlines, and counseling services near you. ? The telephone number is 1-800-662-HELP (1-800-662-4357). ? The website is www.findtreatment.samhsa.gov In countries outside of the [place] and [place], look in local directories for contact information for services in your area. This  information is not intended to replace advice given to you by your health care provider. Make sure you discuss any questions you have with your health care provider. Document Released: 09/27/2005 Document Revised: 09/24/2016 Document Reviewed: 08/17/2014 Elsevier Interactive Patient Education  2017 Elsevier Inc.  

## 2018-08-12 NOTE — Progress Notes (Signed)
[facility] [place], [place] [address]  Pulmonary Sleep Medicine   Office Visit Note  Patient Name: Frank Todd. DOB: [birth year] MRN [id number]  Date of Service: 08/12/2018  Complaints/HPI: Pt here for follow up on COPD.  Pt spiro today shows FEV1/FVC is 61% of predicted. At last visit, pt was using cocaine.  He reports today that he continues to use cocaine 2-4 times a week.   He reports he has been using cocaine since 1975.  He reports he last used 4 days ago, because he is having trouble affording it.  He denies chest pain, SOB, palpitations or other issues.  He denies recent hospitalization.  He has been using his Advair, spiriva and Proventil inhalers as directed.     ROS  General: (-) fever, (-) chills, (-) night sweats, (-) weakness Skin: (-) rashes, (-) itching,. Eyes: (-) visual changes, (-) redness, (-) itching. Nose and Sinuses: (-) nasal stuffiness or itchiness, (-) postnasal drip, (-) nosebleeds, (-) sinus trouble. Mouth and Throat: (-) sore throat, (-) hoarseness. Neck: (-) swollen glands, (-) enlarged thyroid, (-) neck pain. Respiratory: - cough, (-) bloody sputum, + shortness of breath, - wheezing. Cardiovascular: + ankle swelling, (-) chest pain. Lymphatic: (-) lymph node enlargement. Neurologic: (-) numbness, (-) tingling. Psychiatric: (-) anxiety, (-) depression   Current Medication: Outpatient Encounter Medications as of 08/12/2018  Medication Sig  . ADVAIR DISKUS 500-50 MCG/DOSE AEPB INHALE 1 DOSE BY MOUTH TWICE A DAY (Patient taking differently: Inhale 1 puff into the lungs 2 (two) times daily. )  . albuterol (PROVENTIL) (2.5 MG/3ML) 0.083% nebulizer solution INHALE CONTENTS OF 1 VIAL IN NEBULIZER EVERY 4 TO 6 HOURS AS NEEDED  . albuterol (VENTOLIN HFA) 108 (90 Base) MCG/ACT inhaler Inhale 2 puffs into the lungs every 6 (six) hours as needed for wheezing or shortness of breath.  . ALL DAY ALLERGY 10 MG tablet take 1 tablet by mouth  once daily  . amLODipine (NORVASC) 5 MG tablet Take 5 mg by mouth daily. for high blood pressure  . DALIRESP 500 MCG TABS tablet take 1 tablet by mouth once daily  . fluticasone (FLONASE) 50 MCG/ACT nasal spray Place into both nostrils 2 (two) times daily.  . meloxicam (MOBIC) 15 MG tablet Take 15 mg by mouth daily.  . metoprolol succinate (TOPROL-XL) 25 MG 24 hr tablet Take 25 mg by mouth daily.  Marland Kitchen omeprazole (PRILOSEC) 20 MG capsule Take 20 mg by mouth daily.   . OXYGEN Inhale 2.5 L into the lungs continuous.  . predniSONE (DELTASONE) 10 MG tablet Take 1 tablet (10 mg total) by mouth daily.  . predniSONE (DELTASONE) 10 MG tablet Take 1 tablet (10 mg total) by mouth daily.  . predniSONE (STERAPRED UNI-PAK 21 TAB) 10 MG (21) TBPK tablet 6 tabs day 1 and taper 10 mg a day - 6 days  . SPIRIVA HANDIHALER 18 MCG inhalation capsule INHALE 2 PUFFS BY MOUTH ONCE DAILY AS DIRECTED  . tamsulosin (FLOMAX) 0.4 MG CAPS capsule Take 1 capsule (0.4 mg total) by mouth daily after breakfast. AM   Facility-Administered Encounter Medications as of 08/12/2018  Medication  . lidocaine (XYLOCAINE) 2 % jelly 1 application    Surgical History: Past Surgical History:  Procedure Laterality Date  . CATARACT EXTRACTION W/PHACO Right 03/30/2015   Procedure: CATARACT EXTRACTION PHACO AND INTRAOCULAR LENS PLACEMENT (IOC);  Surgeon: Leandrew Koyanagi, MD;  Location: [facility];  Service: Ophthalmology;  Laterality: Right;  . CIRCUMCISION  as child  . DENTAL SURGERY      Medical History: Past Medical History:  Diagnosis Date  . Arthritis    shoulders  . Asthma   . Benign prostatic hypertrophy   . COPD (chronic obstructive pulmonary disease) (HCC)    chronic bronchitis  . GERD (gastroesophageal reflux disease)   . Hypertension   . Seasonal allergies   . Shortness of breath dyspnea   . Wears partial dentures    upper    Family History: History reviewed. No pertinent family history.  Social  History: Social History   Socioeconomic History  . Marital status: Divorced    Spouse name: Not on file  . Number of children: Not on file  . Years of education: Not on file  . Highest education level: Not on file  Occupational History  . Not on file  Social Needs  . Financial resource strain: Not on file  . Food insecurity:    Worry: Not on file    Inability: Not on file  . Transportation needs:    Medical: Not on file    Non-medical: Not on file  Tobacco Use  . Smoking status: Former Smoker    Last attempt to quit: 11/12/1984    Years since quitting: 33.7  . Smokeless tobacco: Never Used  Substance and Sexual Activity  . Alcohol use: Yes    Alcohol/week: 3.0 standard drinks    Types: 3 Shots of liquor per week    Comment: pt said "several pints per week"  . Drug use: No  . Sexual activity: Not Currently  Lifestyle  . Physical activity:    Days per week: Not on file    Minutes per session: Not on file  . Stress: Not on file  Relationships  . Social connections:    Talks on phone: Not on file    Gets together: Not on file    Attends religious service: Not on file    Active member of club or organization: Not on file    Attends meetings of clubs or organizations: Not on file    Relationship status: Not on file  . Intimate partner violence:    Fear of current or ex partner: Not on file    Emotionally abused: Not on file    Physically abused: Not on file    Forced sexual activity: Not on file  Other Topics Concern  . Not on file  Social History Narrative  . Not on file    Vital Signs: Blood pressure 126/68, pulse (!) 109, resp. rate 16, height 6' 2.5" (1.892 m), weight 127 lb 12.8 oz (58 kg), SpO2 93 %.  Examination: General Appearance: The patient is well-developed, well-nourished, and in no distress. Skin: Gross inspection of skin unremarkable. Head: normocephalic, no gross deformities. Eyes: no gross deformities noted. ENT: ears appear grossly normal no  exudates. Neck: Supple. No thyromegaly. No LAD. Respiratory: No wheezing or ronchi noted, diminished lung sounds in bases. Cardiovascular: Normal S1 and S2 without murmur or rub. Extremities: No cyanosis. pulses are equal. Neurologic: Alert and oriented. No involuntary movements.  LABS: Recent Results (from the past 2160 hour(s))  Urinalysis, Complete w Microscopic     Status: Abnormal   Collection Time: 06/06/18 11:34 AM  Result Value Ref Range   Color, Urine YELLOW (A) YELLOW   APPearance CLEAR (A) CLEAR   Specific Gravity, Urine 1.005 1.005 - 1.030   pH 6.0 5.0 - 8.0   Glucose, UA NEGATIVE NEGATIVE mg/dL  Hgb urine dipstick NEGATIVE NEGATIVE   Bilirubin Urine NEGATIVE NEGATIVE   Ketones, ur NEGATIVE NEGATIVE mg/dL   Protein, ur NEGATIVE NEGATIVE mg/dL   Nitrite NEGATIVE NEGATIVE   Leukocytes, UA NEGATIVE NEGATIVE   RBC / HPF 0-5 0 - 5 RBC/hpf   WBC, UA 0-5 0 - 5 WBC/hpf   Bacteria, UA NONE SEEN NONE SEEN   Squamous Epithelial / LPF NONE SEEN 0 - 5   Amorphous Crystal PRESENT     Comment: Performed at [facility], [address] [place], [place] [address]  Urinalysis, Complete w Microscopic     Status: Abnormal   Collection Time: 06/18/18 12:25 PM  Result Value Ref Range   Color, Urine YELLOW (A) YELLOW   APPearance CLOUDY (A) CLEAR   Specific Gravity, Urine 1.004 (L) 1.005 - 1.030   pH 8.0 5.0 - 8.0   Glucose, UA NEGATIVE NEGATIVE mg/dL   Hgb urine dipstick MODERATE (A) NEGATIVE   Bilirubin Urine NEGATIVE NEGATIVE   Ketones, ur NEGATIVE NEGATIVE mg/dL   Protein, ur NEGATIVE NEGATIVE mg/dL   Nitrite NEGATIVE NEGATIVE   Leukocytes, UA LARGE (A) NEGATIVE   RBC / HPF 0-5 0 - 5 RBC/hpf   WBC, UA >50 (H) 0 - 5 WBC/hpf   Bacteria, UA MANY (A) NONE SEEN   Squamous Epithelial / LPF 0-5 0 - 5   WBC Clumps PRESENT     Comment: Performed at [facility], [address] [place], [place] [address]  Basic metabolic panel     Status: Abnormal   Collection  Time: 06/18/18 12:25 PM  Result Value Ref Range   Sodium 135 135 - 145 mmol/L   Potassium 3.4 (L) 3.5 - 5.1 mmol/L   Chloride 96 (L) 98 - 111 mmol/L   CO2 24 22 - 32 mmol/L   Glucose, Bld 70 70 - 99 mg/dL   BUN 16 8 - 23 mg/dL   Creatinine, Ser 1.28 (H) 0.61 - 1.24 mg/dL   Calcium 8.9 8.9 - 10.3 mg/dL   GFR calc non Af Amer 56 (L) >60 mL/min   GFR calc Af Amer >60 >60 mL/min    Comment: (NOTE) The eGFR has been calculated using the CKD EPI equation. This calculation has not been validated in all clinical situations. eGFR's persistently <60 mL/min signify possible Chronic Kidney Disease.    Anion gap 15 5 - 15    Comment: Performed at [facility], [address] [place], [place] [address]  CBC with Differential     Status: Abnormal   Collection Time: 06/18/18 12:25 PM  Result Value Ref Range   WBC 10.1 3.8 - 10.6 K/uL   RBC 4.65 4.40 - 5.90 MIL/uL   Hemoglobin 12.9 (L) 13.0 - 18.0 g/dL   HCT 39.7 (L) 40.0 - 52.0 %   MCV 85.3 80.0 - 100.0 fL   MCH 27.7 26.0 - 34.0 pg   MCHC 32.5 32.0 - 36.0 g/dL   RDW 14.9 (H) 11.5 - 14.5 %   Platelets 232 150 - 440 K/uL   Neutrophils Relative % 71 %   Neutro Abs 7.2 (H) 1.4 - 6.5 K/uL   Lymphocytes Relative 17 %   Lymphs Abs 1.7 1.0 - 3.6 K/uL   Monocytes Relative 10 %   Monocytes Absolute 1.0 0.2 - 1.0 K/uL   Eosinophils Relative 1 %   Eosinophils Absolute 0.1 0 - 0.7 K/uL   Basophils Relative 1 %   Basophils Absolute 0.1 0 - 0.1  K/uL    Comment: Performed at [facility], [address] [place], [place] [address]  Basic metabolic panel     Status: Abnormal   Collection Time: 06/30/18  5:22 PM  Result Value Ref Range   Sodium 136 135 - 145 mmol/L   Potassium 3.5 3.5 - 5.1 mmol/L   Chloride 101 98 - 111 mmol/L   CO2 30 22 - 32 mmol/L   Glucose, Bld 96 70 - 99 mg/dL   BUN 11 8 - 23 mg/dL   Creatinine, Ser 0.89 0.61 - 1.24 mg/dL   Calcium 8.6 (L) 8.9 - 10.3 mg/dL   GFR calc non Af Amer >60 >60 mL/min   GFR  calc Af Amer >60 >60 mL/min    Comment: (NOTE) The eGFR has been calculated using the CKD EPI equation. This calculation has not been validated in all clinical situations. eGFR's persistently <60 mL/min signify possible Chronic Kidney Disease.    Anion gap 5 5 - 15    Comment: Performed at [facility], [address] [place], [place] [address]  CBC     Status: Abnormal   Collection Time: 06/30/18  5:22 PM  Result Value Ref Range   WBC 7.0 3.8 - 10.6 K/uL   RBC 4.96 4.40 - 5.90 MIL/uL   Hemoglobin 13.5 13.0 - 18.0 g/dL   HCT 42.2 40.0 - 52.0 %   MCV 85.1 80.0 - 100.0 fL   MCH 27.3 26.0 - 34.0 pg   MCHC 32.0 32.0 - 36.0 g/dL   RDW 15.0 (H) 11.5 - 14.5 %   Platelets 276 150 - 440 K/uL    Comment: Performed at [facility], [address] [place], [place] [address]  Troponin I     Status: None   Collection Time: 06/30/18  5:22 PM  Result Value Ref Range   Troponin I <0.03 <0.03 ng/mL    Comment: Performed at [facility], [address] [place], [place] [address]  Brain natriuretic peptide     Status: Abnormal   Collection Time: 06/30/18  5:27 PM  Result Value Ref Range   B Natriuretic Peptide 104.0 (H) 0.0 - 100.0 pg/mL    Comment: Performed at [facility] [address] [place], [place] [address]  Urine Drug Screen, Qualitative     Status: Abnormal   Collection Time: 07/17/18  4:01 PM  Result Value Ref Range   Tricyclic, Ur Screen NONE DETECTED NONE DETECTED   Amphetamines, Ur Screen NONE DETECTED NONE DETECTED   MDMA (Ecstasy)Ur Screen NONE DETECTED NONE DETECTED   Cocaine Metabolite,Ur [place] POSITIVE (A) NONE DETECTED   Opiate, Ur Screen NONE DETECTED NONE DETECTED   Phencyclidine (PCP) Ur S NONE DETECTED NONE DETECTED   Cannabinoid 50 Ng, Ur [place] NONE DETECTED NONE DETECTED   Barbiturates, Ur Screen NONE DETECTED NONE DETECTED   Benzodiazepine, Ur Scrn TEST NOT PERFORMED, REAGENT NOT AVAILABLE (A) NONE DETECTED   Methadone Scn, Ur  NONE DETECTED NONE DETECTED    Comment: (NOTE) Tricyclics + metabolites, urine    Cutoff 1000 ng/mL Amphetamines + metabolites, urine  Cutoff 1000 ng/mL MDMA (Ecstasy), urine              Cutoff 500 ng/mL Cocaine Metabolite, urine          Cutoff 300 ng/mL Opiate + metabolites, urine        Cutoff 300 ng/mL Phencyclidine (PCP), urine         Cutoff 25 ng/mL Cannabinoid,  urine                 Cutoff 50 ng/mL Barbiturates + metabolites, urine  Cutoff 200 ng/mL Benzodiazepine, urine              Cutoff 200 ng/mL Methadone, urine                   Cutoff 300 ng/mL The urine drug screen provides only a preliminary, unconfirmed analytical test result and should not be used for non-medical purposes. Clinical consideration and professional judgment should be applied to any positive drug screen result due to possible interfering substances. A more specific alternate chemical method must be used in order to obtain a confirmed analytical result. Gas chromatography / mass spectrometry (GC/MS) is the preferred confirmat ory method. Performed at [facility], [address] [place], [place] [address]   Comprehensive metabolic panel     Status: Abnormal   Collection Time: 07/17/18  4:02 PM  Result Value Ref Range   Sodium 138 135 - 145 mmol/L   Potassium 3.9 3.5 - 5.1 mmol/L   Chloride 99 98 - 111 mmol/L   CO2 29 22 - 32 mmol/L   Glucose, Bld 130 (H) 70 - 99 mg/dL   BUN 23 8 - 23 mg/dL   Creatinine, Ser 1.14 0.61 - 1.24 mg/dL   Calcium 8.8 (L) 8.9 - 10.3 mg/dL   Total Protein 6.8 6.5 - 8.1 g/dL   Albumin 4.4 3.5 - 5.0 g/dL   AST 24 15 - 41 U/L   ALT 14 0 - 44 U/L   Alkaline Phosphatase 49 38 - 126 U/L   Total Bilirubin 1.0 0.3 - 1.2 mg/dL   GFR calc non Af Amer >60 >60 mL/min   GFR calc Af Amer >60 >60 mL/min    Comment: (NOTE) The eGFR has been calculated using the CKD EPI equation. This calculation has not been validated in all clinical situations. eGFR's persistently <60  mL/min signify possible Chronic Kidney Disease.    Anion gap 10 5 - 15    Comment: Performed at [facility], [address] [place], [place] [address]  Ethanol     Status: None   Collection Time: 07/17/18  4:02 PM  Result Value Ref Range   Alcohol, Ethyl (B) <10 <10 mg/dL    Comment: (NOTE) Lowest detectable limit for serum alcohol is 10 mg/dL. For medical purposes only. Performed at [facility], [address] [place], [place] [address]   cbc     Status: Abnormal   Collection Time: 07/17/18  4:02 PM  Result Value Ref Range   WBC 9.4 3.8 - 10.6 K/uL   RBC 5.26 4.40 - 5.90 MIL/uL   Hemoglobin 14.2 13.0 - 18.0 g/dL   HCT 44.3 40.0 - 52.0 %   MCV 84.1 80.0 - 100.0 fL   MCH 27.0 26.0 - 34.0 pg   MCHC 32.1 32.0 - 36.0 g/dL   RDW 14.9 (H) 11.5 - 14.5 %   Platelets 242 150 - 440 K/uL    Comment: Performed at [facility], [address], [place], [place] [address]    Radiology: No results found.  No results found.  No results found.    Assessment and Plan: Patient Active Problem List   Diagnosis Date Noted  . Psychosis ([place]) 07/17/2018  . Cocaine abuse ([place]) 07/17/2018  . Violent behavior 07/17/2018  . Acute respiratory failure ([place]) 03/31/2018  . Microscopic hematuria 12/19/2017  .  Pneumonia 11/26/2017  . Chronic respiratory failure with hypoxia ([place]) 11/26/2017  . Candidal stomatitis 11/26/2017  . Atelectasis 11/26/2017  . Solitary pulmonary nodule 11/26/2017  . 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 Chronic obstructive pulmonary disease with acute exacerbation ([place]) 11/26/2017  . 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 Hemoptysis 11/26/2017  . Shortness of breath 11/26/2017  . Hypertension, essential 11/26/2017  . Benign prostatic hyperplasia with lower urinary tract symptoms 03/12/2014    1. Chronic obstructive pulmonary disease, unspecified COPD type ([place]) Pt's COPD is severe.  He continues to reports  cocaine abuse.  We again discussed him stopping cocaine, he would like to but he isn't sure he can.  At last visit he was given instructions, and facility addreses for detox.  He reports he decided not to go to detox.    2. Cocaine abuse ([place]) He would benefit from an inpatient detox and rehab program.  The patient is unwilling to follow through at this time.  He denies SI or HI.  He is pleasant and cooperative at this time.  We discussed the dangers of his cocaine habit for his heart, and lungs.  He verbalized understanding, and states he is trying to cut back on his use.  Once again offered him detox facility information, but he declines at this time.    3. Hypertension, essential Controlled today, however he is tachycardic.  Which I likely due to cocaine use.   4. SOB (shortness of breath) Secondary to severe COPD.  - Spirometry with Graph  General Counseling: I have discussed the findings of the evaluation and examination with Jamerion.  I have also discussed any further diagnostic evaluation thatmay be needed or ordered today. [place] verbalizes understanding of the findings of todays visit. We also reviewed his medications today and discussed drug interactions and side effects including but not limited excessive drowsiness and altered mental states. We also discussed that there is always a risk not just to him but also people around him. he has been encouraged to call the office with any questions or concerns that should arise related to todays visit.    Time spent: 25 This patient was seen by Orson Gear AGNP-C in Collaboration with Dr. Devona Konig as a part of collaborative care agreement.  I have personally obtained a history, examined the patient, evaluated laboratory and imaging results, formulated the assessment and plan and placed orders.    Allyne Gee, MD [facility] Pulmonary and Critical Care Sleep medicine

## 2018-08-13 ENCOUNTER — Emergency Department
Admission: EM | Admit: 2018-08-13 | Discharge: 2018-08-14 | Disposition: A | Discharge status: Home / Self Care | Payer: Medicare Other | Attending: Emergency Medicine | Admitting: Emergency Medicine

## 2018-08-13 ENCOUNTER — Other Ambulatory Visit: Payer: Self-pay

## 2018-08-13 DIAGNOSIS — F29 Unspecified psychosis not due to a substance or known physiological condition: Secondary | ICD-10-CM | POA: Diagnosis not present

## 2018-08-13 DIAGNOSIS — I10 Essential (primary) hypertension: Secondary | ICD-10-CM | POA: Diagnosis not present

## 2018-08-13 DIAGNOSIS — Z87891 Personal history of nicotine dependence: Secondary | ICD-10-CM | POA: Diagnosis not present

## 2018-08-13 DIAGNOSIS — F1994 Other psychoactive substance use, unspecified with psychoactive substance-induced mood disorder: Secondary | ICD-10-CM | POA: Diagnosis not present

## 2018-08-13 DIAGNOSIS — R441 Visual hallucinations: Secondary | ICD-10-CM | POA: Insufficient documentation

## 2018-08-13 DIAGNOSIS — Z79899 Other long term (current) drug therapy: Secondary | ICD-10-CM | POA: Diagnosis not present

## 2018-08-13 DIAGNOSIS — J449 Chronic obstructive pulmonary disease, unspecified: Secondary | ICD-10-CM | POA: Insufficient documentation

## 2018-08-13 DIAGNOSIS — F1490 Cocaine use, unspecified, uncomplicated: Secondary | ICD-10-CM

## 2018-08-13 DIAGNOSIS — F1410 Cocaine abuse, uncomplicated: Secondary | ICD-10-CM | POA: Diagnosis present

## 2018-08-13 DIAGNOSIS — R443 Hallucinations, unspecified: Secondary | ICD-10-CM

## 2018-08-13 LAB — COMPREHENSIVE METABOLIC PANEL
ALBUMIN: 4.5 g/dL (ref 3.5–5.0)
ALT: 25 U/L (ref 0–44)
AST: 25 U/L (ref 15–41)
Alkaline Phosphatase: 56 U/L (ref 38–126)
Anion gap: 8 (ref 5–15)
BUN: 25 mg/dL — AB (ref 8–23)
CHLORIDE: 101 mmol/L (ref 98–111)
CO2: 29 mmol/L (ref 22–32)
CREATININE: 1.52 mg/dL — AB (ref 0.61–1.24)
Calcium: 8.9 mg/dL (ref 8.9–10.3)
GFR calc Af Amer: 53 mL/min — ABNORMAL LOW (ref >60)
GFR, EST NON AFRICAN AMERICAN: 45 mL/min — AB (ref >60)
GLUCOSE: 123 mg/dL — AB (ref 70–99)
POTASSIUM: 4.7 mmol/L (ref 3.5–5.1)
Sodium: 138 mmol/L (ref 135–145)
Total Bilirubin: 1 mg/dL (ref 0.3–1.2)
Total Protein: 6.8 g/dL (ref 6.5–8.1)

## 2018-08-13 LAB — URINALYSIS, COMPLETE (UACMP) WITH MICROSCOPIC
BACTERIA UA: NONE SEEN
Bilirubin Urine: NEGATIVE
Glucose, UA: NEGATIVE mg/dL
KETONES UR: NEGATIVE mg/dL
LEUKOCYTES UA: NEGATIVE
Nitrite: NEGATIVE
PH: 5 (ref 5.0–8.0)
PROTEIN: 30 mg/dL — AB
Specific Gravity, Urine: 1.019 (ref 1.005–1.030)

## 2018-08-13 LAB — URINE DRUG SCREEN, QUALITATIVE (ARMC ONLY)
AMPHETAMINES, UR SCREEN: NOT DETECTED
BARBITURATES, UR SCREEN: NOT DETECTED
BENZODIAZEPINE, UR SCRN: NOT DETECTED
Cannabinoid 50 Ng, Ur ~~LOC~~: NOT DETECTED
Cocaine Metabolite,Ur ~~LOC~~: POSITIVE — AB
MDMA (Ecstasy)Ur Screen: NOT DETECTED
METHADONE SCREEN, URINE: NOT DETECTED
OPIATE, UR SCREEN: NOT DETECTED
Phencyclidine (PCP) Ur S: NOT DETECTED
Tricyclic, Ur Screen: NOT DETECTED

## 2018-08-13 LAB — CBC
HEMATOCRIT: 39.5 % — AB (ref 40.0–52.0)
Hemoglobin: 13.1 g/dL (ref 13.0–18.0)
MCH: 27.9 pg (ref 26.0–34.0)
MCHC: 33.3 g/dL (ref 32.0–36.0)
MCV: 83.9 fL (ref 80.0–100.0)
PLATELETS: 214 10*3/uL (ref 150–440)
RBC: 4.71 MIL/uL (ref 4.40–5.90)
RDW: 15 % — AB (ref 11.5–14.5)
WBC: 9.4 10*3/uL (ref 3.8–10.6)

## 2018-08-13 LAB — ETHANOL

## 2018-08-13 MED ORDER — METOPROLOL SUCCINATE ER 50 MG PO TB24
25.0000 mg | ORAL_TABLET | Freq: Every day | ORAL | Status: DC
  Administered 2018-08-14: 25 mg via ORAL
  Filled 2018-08-13: qty 1

## 2018-08-13 MED ORDER — AMLODIPINE BESYLATE 5 MG PO TABS
5.0000 mg | ORAL_TABLET | Freq: Every day | ORAL | Status: DC
  Administered 2018-08-14: 5 mg via ORAL
  Filled 2018-08-13: qty 1

## 2018-08-13 MED ORDER — PANTOPRAZOLE SODIUM 40 MG PO TBEC
40.0000 mg | DELAYED_RELEASE_TABLET | Freq: Every day | ORAL | Status: DC
  Administered 2018-08-14: 40 mg via ORAL
  Filled 2018-08-13: qty 1

## 2018-08-13 MED ORDER — MELOXICAM 7.5 MG PO TABS
15.0000 mg | ORAL_TABLET | Freq: Every day | ORAL | Status: DC
  Administered 2018-08-14: 15 mg via ORAL
  Filled 2018-08-13: qty 2

## 2018-08-13 MED ORDER — ROFLUMILAST 500 MCG PO TABS
500.0000 ug | ORAL_TABLET | Freq: Every day | ORAL | Status: DC
  Administered 2018-08-14: 500 ug via ORAL
  Filled 2018-08-13: qty 1

## 2018-08-13 MED ORDER — TAMSULOSIN HCL 0.4 MG PO CAPS
0.4000 mg | ORAL_CAPSULE | Freq: Every day | ORAL | Status: DC
  Administered 2018-08-14: 0.4 mg via ORAL
  Filled 2018-08-13: qty 1

## 2018-08-13 MED ORDER — LORATADINE 10 MG PO TABS
10.0000 mg | ORAL_TABLET | Freq: Every day | ORAL | Status: DC
  Administered 2018-08-14: 10 mg via ORAL
  Filled 2018-08-13: qty 1

## 2018-08-13 MED ORDER — ACETAMINOPHEN 325 MG PO TABS
650.0000 mg | ORAL_TABLET | Freq: Once | ORAL | Status: AC
  Administered 2018-08-13: 650 mg via ORAL
  Filled 2018-08-13: qty 2

## 2018-08-13 MED ORDER — TIOTROPIUM BROMIDE MONOHYDRATE 18 MCG IN CAPS
18.0000 ug | ORAL_CAPSULE | Freq: Every day | RESPIRATORY_TRACT | Status: DC
  Administered 2018-08-14: 18 ug via RESPIRATORY_TRACT
  Filled 2018-08-13: qty 5

## 2018-08-13 MED ORDER — ALBUTEROL SULFATE (2.5 MG/3ML) 0.083% IN NEBU: 2.5000 mg | INHALATION_SOLUTION | RESPIRATORY_TRACT | Status: DC | PRN

## 2018-08-13 MED ORDER — FLUTICASONE PROPIONATE 50 MCG/ACT NA SUSP
1.0000 | Freq: Every day | NASAL | Status: DC
  Administered 2018-08-14: 1 via NASAL
  Filled 2018-08-13: qty 16
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
## 2018-08-13 NOTE — ED Notes (Addendum)
Pt dressed out by this tech and Waynetta Sandy, EDT and Raquel, Charity fundraiser. Pts belongings include: trousers, underwear, grey bracelet, polo and pair of Croc shoes.  Pt belongings put in belongs bag and labeled.

## 2018-08-13 NOTE — ED Triage Notes (Signed)
PT brought in by [facility] from home for hallucinations, ems states he has hx of the same. Was brought in under IVC.

## 2018-08-14 DIAGNOSIS — R441 Visual hallucinations: Secondary | ICD-10-CM | POA: Diagnosis not present

## 2018-08-14 DIAGNOSIS — F1994 Other psychoactive substance use, unspecified with psychoactive substance-induced mood disorder: Secondary | ICD-10-CM | POA: Diagnosis not present

## 2018-08-14 NOTE — Consult Note (Signed)
[facility] Face-to-Face Psychiatry Consult   Reason for Consult: Consult for this 67 year old man who came to the hospital under IVC after once again having visual hallucinations Referring Physician: Archie Balboa Patient Identification: Frank Todd. MRN:  [id number] Principal Diagnosis: Substance induced mood disorder ([place]) Diagnosis:   Patient Active Problem List   Diagnosis Date Noted  . Substance induced mood disorder ([place]) [F19.94] 08/14/2018  . Psychosis ([place]) [F29] 07/17/2018  . Cocaine abuse ([place]) [F14.10] 07/17/2018  . Violent behavior [R45.6] 07/17/2018  . Acute respiratory failure ([place]) [J96.00] 03/31/2018  . Microscopic hematuria [R31.29] 12/19/2017  . Pneumonia [J18.9] 11/26/2017  . Chronic respiratory failure with hypoxia ([place]) [J96.11] 11/26/2017  . Candidal stomatitis [B37.0] 11/26/2017  . Atelectasis [J98.11] 11/26/2017  . Solitary pulmonary nodule [R91.1] 11/26/2017  . 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 Chronic obstructive pulmonary disease with acute exacerbation ([place]) [J44.1] 11/26/2017  . 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 Hemoptysis [R04.2] 11/26/2017  . Shortness of breath [R06.02] 11/26/2017  . Hypertension, essential [I10] 11/26/2017  . Benign prostatic hyperplasia with lower urinary tract symptoms [N40.1] 03/12/2014    Total Time spent with patient: 1 hour  Subjective:   Frank Todd. is a 67 y.o. male patient admitted with "I guess I was seeing things again".  HPI: Patient seen chart reviewed.  This is a 67 year old man with multiple medical problems who was brought in after he called law enforcement once again claiming that he was seeing intruders in his house.  The commitment papers claim that he made statements about shooting these imaginary intruders with a shotgun.  Patient has been calm and compliant since coming to the emergency room.  Patient remembers seeing people in his house.  He understands that he  was mistaken about it but is still at a loss to explain what happened.  He denies to me that he made any comments at all about shooting anyone although he admits that he still has firearms at home.  Patient says his mood is feeling all right.  Denies any suicidal or homicidal thoughts at all.  He does admit that he is once again been dabbling in cocaine use.  He also wants to blame some of this problem on his multiple prescription medicines he is taking but those have been stable for a while.  Social history: Lives by himself.  A sister who looks in on him is his closest relative.  Medical history: Chronic oxygen use from COPD  Substance abuse history: This is the second time now in the last few months he has presented to the emergency room with hallucinations almost certainly related to his cocaine use.  Past Psychiatric History: Prior to this the patient had had no previous psychiatric history.  No history of psychiatric hospitalization or medication.  No history of suicide attempts no previous history of violence.  Risk to Self: Suicidal Ideation: No Suicidal Intent: No Is patient at risk for suicide?: No Suicidal Plan?: No Access to Means: No What has been your use of drugs/alcohol within the last 12 months?: Cocaine How many times?: 0 Other Self Harm Risks: Reports of none Triggers for Past Attempts: None known Intentional Self Injurious Behavior: None Risk to Others: Homicidal Ideation: No Thoughts of Harm to Others: No Current Homicidal Intent: No Current Homicidal Plan: No Access to Homicidal Means: No Identified Victim: Reports of none History of harm to others?: No Assessment of Violence: None Noted Violent Behavior Description: Reports of none Does patient  have access to weapons?: No Criminal Charges Pending?: No Does patient have a court date: No Prior Inpatient Therapy: Prior Inpatient Therapy: Yes Prior Therapy Dates: 2016 Prior Therapy Facilty/Provider(s): [facility]  Facility Reason for Treatment: SA Treatment Prior Outpatient Therapy: Prior Outpatient Therapy: No Does patient have an ACCT team?: No Does patient have Intensive In-House Services?  : No Does patient have [facility] services? : No Does patient have P4CC services?: No  Past Medical History:  Past Medical History:  Diagnosis Date  . Arthritis    shoulders  . Asthma   . Benign prostatic hypertrophy   . COPD (chronic obstructive pulmonary disease) (HCC)    chronic bronchitis  . GERD (gastroesophageal reflux disease)   . Hypertension   . Seasonal allergies   . Shortness of breath dyspnea   . Wears partial dentures    upper    Past Surgical History:  Procedure Laterality Date  . CATARACT EXTRACTION W/PHACO Right 03/30/2015   Procedure: CATARACT EXTRACTION PHACO AND INTRAOCULAR LENS PLACEMENT (IOC);  Surgeon: Leandrew Koyanagi, MD;  Location: [place];  Service: Ophthalmology;  Laterality: Right;  . CIRCUMCISION     as child  . DENTAL SURGERY     Family History: No family history on file. Family Psychiatric  History: None known Social History:  Social History   Substance and Sexual Activity  Alcohol Use Yes  . Alcohol/week: 3.0 standard drinks  . Types: 3 Shots of liquor per week   Comment: pt said "several pints per week"     Social History   Substance and Sexual Activity  Drug Use No    Social History   Socioeconomic History  . Marital status: Divorced    Spouse name: Not on file  . Number of children: Not on file  . Years of education: Not on file  . Highest education level: Not on file  Occupational History  . Not on file  Social Needs  . Financial resource strain: Not on file  . Food insecurity:    Worry: Not on file    Inability: Not on file  . Transportation needs:    Medical: Not on file    Non-medical: Not on file  Tobacco Use  . Smoking status: Former Smoker    Last attempt to quit: 11/12/1984    Years since quitting: 33.7  . Smokeless  tobacco: Never Used  Substance and Sexual Activity  . Alcohol use: Yes    Alcohol/week: 3.0 standard drinks    Types: 3 Shots of liquor per week    Comment: pt said "several pints per week"  . Drug use: No  . Sexual activity: Not Currently  Lifestyle  . Physical activity:    Days per week: Not on file    Minutes per session: Not on file  . Stress: Not on file  Relationships  . Social connections:    Talks on phone: Not on file    Gets together: Not on file    Attends religious service: Not on file    Active member of club or organization: Not on file    Attends meetings of clubs or organizations: Not on file    Relationship status: Not on file  Other Topics Concern  . Not on file  Social History Narrative  . Not on file   Additional Social History:    Allergies:  No Known Allergies  Labs:  Results for orders placed or performed during the hospital encounter of 08/13/18 (from the past 48  hour(s))  CBC     Status: Abnormal   Collection Time: 08/13/18 10:11 PM  Result Value Ref Range   WBC 9.4 3.8 - 10.6 K/uL   RBC 4.71 4.40 - 5.90 MIL/uL   Hemoglobin 13.1 13.0 - 18.0 g/dL   HCT 39.5 (L) 40.0 - 52.0 %   MCV 83.9 80.0 - 100.0 fL   MCH 27.9 26.0 - 34.0 pg   MCHC 33.3 32.0 - 36.0 g/dL   RDW 15.0 (H) 11.5 - 14.5 %   Platelets 214 150 - 440 K/uL    Comment: Performed at [facility], [address] [place], [place] [address]  Comprehensive metabolic panel     Status: Abnormal   Collection Time: 08/13/18 10:11 PM  Result Value Ref Range   Sodium 138 135 - 145 mmol/L   Potassium 4.7 3.5 - 5.1 mmol/L   Chloride 101 98 - 111 mmol/L   CO2 29 22 - 32 mmol/L   Glucose, Bld 123 (H) 70 - 99 mg/dL   BUN 25 (H) 8 - 23 mg/dL   Creatinine, Ser 1.52 (H) 0.61 - 1.24 mg/dL   Calcium 8.9 8.9 - 10.3 mg/dL   Total Protein 6.8 6.5 - 8.1 g/dL   Albumin 4.5 3.5 - 5.0 g/dL   AST 25 15 - 41 U/L   ALT 25 0 - 44 U/L   Alkaline Phosphatase 56 38 - 126 U/L   Total Bilirubin 1.0  0.3 - 1.2 mg/dL   GFR calc non Af Amer 45 (L) >60 mL/min   GFR calc Af Amer 53 (L) >60 mL/min    Comment: (NOTE) The eGFR has been calculated using the CKD EPI equation. This calculation has not been validated in all clinical situations. eGFR's persistently <60 mL/min signify possible Chronic Kidney Disease.    Anion gap 8 5 - 15    Comment: Performed at [facility], [address] [place], [place] [address]  Ethanol     Status: None   Collection Time: 08/13/18 10:11 PM  Result Value Ref Range   Alcohol, Ethyl (B) <10 <10 mg/dL    Comment: (NOTE) Lowest detectable limit for serum alcohol is 10 mg/dL. For medical purposes only. Performed at [facility], [address] [place], [place] [address]   Urinalysis, Complete w Microscopic     Status: Abnormal   Collection Time: 08/13/18 10:11 PM  Result Value Ref Range   Color, Urine YELLOW (A) YELLOW   APPearance CLEAR (A) CLEAR   Specific Gravity, Urine 1.019 1.005 - 1.030   pH 5.0 5.0 - 8.0   Glucose, UA NEGATIVE NEGATIVE mg/dL   Hgb urine dipstick MODERATE (A) NEGATIVE   Bilirubin Urine NEGATIVE NEGATIVE   Ketones, ur NEGATIVE NEGATIVE mg/dL   Protein, ur 30 (A) NEGATIVE mg/dL   Nitrite NEGATIVE NEGATIVE   Leukocytes, UA NEGATIVE NEGATIVE   RBC / HPF 0-5 0 - 5 RBC/hpf   WBC, UA 0-5 0 - 5 WBC/hpf   Bacteria, UA NONE SEEN NONE SEEN   Squamous Epithelial / LPF 0-5 0 - 5   Mucus PRESENT     Comment: Performed at [facility], [address] [place], [place] [address]  Urine Drug Screen, Qualitative ([place] only)     Status: Abnormal   Collection Time: 08/13/18 10:11 PM  Result Value Ref Range   Tricyclic, Ur Screen NONE DETECTED NONE DETECTED   Amphetamines, Ur Screen NONE DETECTED NONE DETECTED   MDMA (Ecstasy)Ur Screen NONE DETECTED  NONE DETECTED   Cocaine Metabolite,Ur [place] POSITIVE (A) NONE DETECTED   Opiate, Ur Screen NONE DETECTED NONE DETECTED   Phencyclidine (PCP) Ur S NONE DETECTED NONE  DETECTED   Cannabinoid 50 Ng, Ur [place] NONE DETECTED NONE DETECTED   Barbiturates, Ur Screen NONE DETECTED NONE DETECTED   Benzodiazepine, Ur Scrn NONE DETECTED NONE DETECTED   Methadone Scn, Ur NONE DETECTED NONE DETECTED    Comment: (NOTE) Tricyclics + metabolites, urine    Cutoff 1000 ng/mL Amphetamines + metabolites, urine  Cutoff 1000 ng/mL MDMA (Ecstasy), urine              Cutoff 500 ng/mL Cocaine Metabolite, urine          Cutoff 300 ng/mL Opiate + metabolites, urine        Cutoff 300 ng/mL Phencyclidine (PCP), urine         Cutoff 25 ng/mL Cannabinoid, urine                 Cutoff 50 ng/mL Barbiturates + metabolites, urine  Cutoff 200 ng/mL Benzodiazepine, urine              Cutoff 200 ng/mL Methadone, urine                   Cutoff 300 ng/mL The urine drug screen provides only a preliminary, unconfirmed analytical test result and should not be used for non-medical purposes. Clinical consideration and professional judgment should be applied to any positive drug screen result due to possible interfering substances. A more specific alternate chemical method must be used in order to obtain a confirmed analytical result. Gas chromatography / mass spectrometry (GC/MS) is the preferred confirmat ory method. Performed at [facility], [address] [place], [place] [address]     Current Facility-Administered Medications  Medication Dose Route Frequency Provider Last Rate Last Dose  . albuterol (PROVENTIL) (2.5 MG/3ML) 0.083% nebulizer solution 2.5 mg  2.5 mg Nebulization Q4H PRN Nance Pear, MD      . amLODipine (NORVASC) tablet 5 mg  5 mg Oral Daily Nance Pear, MD   5 mg at 08/14/18 0905  . fluticasone (FLONASE) 50 MCG/ACT nasal spray 1 spray  1 spray Each Nare Daily Nance Pear, MD   1 spray at 08/14/18 0908  . lidocaine (XYLOCAINE) 2 % jelly 1 application  1 application Urethral Once Stoioff, Scott C, MD      . loratadine (CLARITIN) tablet 10 mg  10  mg Oral Daily Nance Pear, MD   10 mg at 08/14/18 0906  . meloxicam (MOBIC) tablet 15 mg  15 mg Oral Daily Nance Pear, MD   15 mg at 08/14/18 0906  . metoprolol succinate (TOPROL-XL) 24 hr tablet 25 mg  25 mg Oral Daily Nance Pear, MD   25 mg at 08/14/18 0907  . pantoprazole (PROTONIX) EC tablet 40 mg  40 mg Oral Daily Nance Pear, MD   40 mg at 08/14/18 0906  . roflumilast (DALIRESP) tablet 500 mcg  500 mcg Oral Daily Nance Pear, MD   500 mcg at 08/14/18 0906  . tamsulosin (FLOMAX) capsule 0.4 mg  0.4 mg Oral QPC breakfast Nance Pear, MD   0.4 mg at 08/14/18 0906  . tiotropium (SPIRIVA) inhalation capsule 18 mcg  18 mcg Inhalation Daily Nance Pear, MD   18 mcg at 08/14/18 0906   Current Outpatient Medications  Medication Sig Dispense Refill  . ADVAIR DISKUS 500-50 MCG/DOSE AEPB INHALE 1 DOSE BY MOUTH TWICE A  DAY (Patient taking differently: Inhale 1 puff into the lungs 2 (two) times daily. ) 1 each 2  . albuterol (PROVENTIL) (2.5 MG/3ML) 0.083% nebulizer solution INHALE CONTENTS OF 1 VIAL IN NEBULIZER EVERY 4 TO 6 HOURS AS NEEDED 375 mL 0  . albuterol (VENTOLIN HFA) 108 (90 Base) MCG/ACT inhaler Inhale 2 puffs into the lungs every 6 (six) hours as needed for wheezing or shortness of breath. 1 Inhaler 1  . ALL DAY ALLERGY 10 MG tablet take 1 tablet by mouth once daily 30 tablet 7  . amLODipine (NORVASC) 5 MG tablet Take 5 mg by mouth daily. for high blood pressure  0  . DALIRESP 500 MCG TABS tablet take 1 tablet by mouth once daily 30 tablet 2  . fluticasone (FLONASE) 50 MCG/ACT nasal spray Place into both nostrils 2 (two) times daily.    . meloxicam (MOBIC) 15 MG tablet Take 15 mg by mouth daily.  0  . metoprolol succinate (TOPROL-XL) 25 MG 24 hr tablet Take 25 mg by mouth daily.  0  . omeprazole (PRILOSEC) 20 MG capsule Take 20 mg by mouth daily.     . OXYGEN Inhale 2.5 L into the lungs continuous.    Marland Kitchen SPIRIVA HANDIHALER 18 MCG inhalation capsule INHALE  2 PUFFS BY MOUTH ONCE DAILY AS DIRECTED 30 capsule 0  . tamsulosin (FLOMAX) 0.4 MG CAPS capsule Take 1 capsule (0.4 mg total) by mouth daily after breakfast. AM 90 capsule 3    Musculoskeletal: Strength & Muscle Tone: within normal limits Gait & Station: unsteady Patient leans: N/A  Psychiatric Specialty Exam: Physical Exam  Nursing note and vitals reviewed. Constitutional: He appears well-developed.  HENT:  Head: Normocephalic and atraumatic.  Eyes: Pupils are equal, round, and reactive to light. Conjunctivae are normal.  Neck: Normal range of motion.  Cardiovascular: Regular rhythm and normal heart sounds.  Respiratory: He is in respiratory distress.  GI: Soft.  Musculoskeletal: Normal range of motion.  Neurological: He is alert.  Skin: Skin is warm and dry.  Psychiatric: He has a normal mood and affect. Judgment normal. His speech is delayed. He is slowed. Thought content is not paranoid. He expresses no homicidal and no suicidal ideation. He exhibits abnormal recent memory.    Review of Systems  Constitutional: Negative.   HENT: Negative.   Eyes: Negative.   Respiratory: Negative.   Cardiovascular: Negative.   Gastrointestinal: Negative.   Musculoskeletal: Negative.   Skin: Negative.   Neurological: Negative.   Psychiatric/Behavioral: Positive for hallucinations, memory loss and substance abuse. Negative for depression and suicidal ideas. The patient has insomnia. The patient is not nervous/anxious.     Blood pressure 117/73, pulse 73, temperature 98.2 F (36.8 C), temperature source Oral, resp. rate 18, height 6' 3"  (1.905 m), weight 59 kg, SpO2 100 %.Body mass index is 16.25 kg/m.  General Appearance: Casual  Eye Contact:  Fair  Speech:  Clear and Coherent  Volume:  Normal  Mood:  Euthymic  Affect:  Constricted  Thought Process:  Goal Directed  Orientation:  Full (Time, Place, and Person)  Thought Content:  Logical  Suicidal Thoughts:  No  Homicidal Thoughts:   No  Memory:  Immediate;   Fair Recent;   Fair Remote;   Fair  Judgement:  Fair  Insight:  Fair  Psychomotor Activity:  Normal  Concentration:  Concentration: Fair  Recall:  [employer] of Knowledge:  Fair  Language:  Fair  Akathisia:  No  Handed:  Right  AIMS (if indicated):     Assets:  Desire for Improvement Housing  ADL's:  Intact  Cognition:  Impaired,  Mild  Sleep:        Treatment Plan Summary: Plan 67 year old man with mild cognitive impairment and cocaine abuse.  Patient is not currently suicidal or homicidal.  Last time he was here with this condition we tried to refer him to geriatric psychiatry units with no success which leads me to believe that we are unlikely to have any success with that this time as well.  Tried to do some psychoeducation pointing out to him how his cocaine use is putting him at risk with this dangerous behavior.  Patient states he understands and once again says he plans to stop using cocaine.  Case reviewed with emergency room doctor and TTS.  Discontinue IVC.  Patient can be discharged home with follow-up with primary care treatment and outpatient substance abuse counseling  Disposition: No evidence of imminent risk to self or others at present.   Patient does not meet criteria for psychiatric inpatient admission. Supportive therapy provided about ongoing stressors. Discussed crisis plan, support from social network, calling 911, coming to the Emergency Department, and calling Suicide Hotline.  Alethia Berthold, MD 08/14/2018 3:45 PM

## 2018-08-14 NOTE — ED Notes (Signed)
Pt discharged to friend in lobby. VS stable. Pt verbalized understanding of discharge paperwork. Denies SI/HI. All belongings (including medications) returned to patient.
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
## 2018-08-14 NOTE — ED Provider Notes (Signed)
Patient seen by Dr. Toni Amend.  IVC rescinded.   Phineas Semen, MD 08/14/18 [phone]

## 2018-08-14 NOTE — ED Notes (Signed)
Pt given lunch tray.

## 2018-08-14 NOTE — ED Notes (Signed)
Pt. Requested and was given meal tray and drink. 
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
## 2018-08-14 NOTE — ED Notes (Signed)
Pt given breakfast tray

## 2018-08-14 NOTE — ED Notes (Signed)
SOC called report given, SOC machine set up in patients room.  Pt. Woke up and given OJ upon request.

## 2018-08-14 NOTE — Discharge Instructions (Addendum)
Please seek medical attention and help for any thoughts about wanting to harm yourself, harm others, any concerning change in behavior, severe depression, inappropriate drug use or any other new or concerning symptoms. ° °

## 2018-08-14 NOTE — ED Notes (Signed)
Pt given warm blanket and pillow. Pt wanted light cut off, but stated it was to dark when I did so. Light was cut back on.

## 2018-08-18 ENCOUNTER — Other Ambulatory Visit: Payer: Self-pay | Admitting: Internal Medicine

## 2018-08-25 ENCOUNTER — Other Ambulatory Visit: Payer: Self-pay | Admitting: Internal Medicine

## 2018-08-26 ENCOUNTER — Other Ambulatory Visit: Payer: Self-pay

## 2018-08-26 MED ORDER — PREDNISONE 10 MG PO TABS: 10.0000 mg | ORAL_TABLET | Freq: Every day | ORAL | 1 refills | Status: DC

## 2018-09-15 ENCOUNTER — Other Ambulatory Visit: Payer: Self-pay | Admitting: Internal Medicine

## 2018-10-05 ENCOUNTER — Other Ambulatory Visit: Payer: Self-pay | Admitting: Internal Medicine

## 2018-10-16 ENCOUNTER — Other Ambulatory Visit: Payer: Self-pay | Admitting: Internal Medicine

## 2018-10-22 ENCOUNTER — Other Ambulatory Visit: Payer: Self-pay | Admitting: Adult Health

## 2018-10-23 ENCOUNTER — Other Ambulatory Visit: Payer: Self-pay | Admitting: Internal Medicine

## 2018-11-10 ENCOUNTER — Other Ambulatory Visit: Payer: Self-pay | Admitting: Internal Medicine

## 2018-11-17 ENCOUNTER — Other Ambulatory Visit: Payer: Self-pay | Admitting: Adult Health

## 2018-11-18 ENCOUNTER — Other Ambulatory Visit: Payer: Self-pay | Admitting: Adult Health

## 2018-12-13 ENCOUNTER — Other Ambulatory Visit: Payer: Self-pay | Admitting: Adult Health

## 2018-12-24 ENCOUNTER — Other Ambulatory Visit: Payer: Self-pay

## 2018-12-24 MED ORDER — ALBUTEROL SULFATE (2.5 MG/3ML) 0.083% IN NEBU: INHALATION_SOLUTION | RESPIRATORY_TRACT | 0 refills | Status: DC

## 2018-12-25 ENCOUNTER — Ambulatory Visit (INDEPENDENT_AMBULATORY_CARE_PROVIDER_SITE_OTHER): Payer: Medicare Other | Admitting: Adult Health

## 2018-12-25 ENCOUNTER — Encounter: Payer: Self-pay | Admitting: Adult Health

## 2018-12-25 ENCOUNTER — Other Ambulatory Visit: Payer: Self-pay

## 2018-12-25 VITALS — BP 136/78 | HR 66 | Resp 16 | Ht 75.5 in | Wt 141.0 lb

## 2018-12-25 DIAGNOSIS — I10 Essential (primary) hypertension: Secondary | ICD-10-CM | POA: Diagnosis not present

## 2018-12-25 DIAGNOSIS — J449 Chronic obstructive pulmonary disease, unspecified: Secondary | ICD-10-CM | POA: Diagnosis not present

## 2018-12-25 DIAGNOSIS — R0602 Shortness of breath: Secondary | ICD-10-CM | POA: Diagnosis not present

## 2018-12-25 DIAGNOSIS — R062 Wheezing: Secondary | ICD-10-CM | POA: Diagnosis not present

## 2018-12-25 MED ORDER — AZITHROMYCIN 250 MG PO TABS: ORAL_TABLET | ORAL | 0 refills | Status: AC

## 2018-12-25 NOTE — Progress Notes (Signed)
[facility] [address] [place], [place] [address]  Pulmonary Sleep Medicine   Office Visit Note  Patient Name: Frank RecordsClaude H Rossano Jr. DOB: 11/28/1950 MRN [id number]  Date of Service: 12/25/2018  Complaints/HPI: Pt here for follow up on copd.  Pt reports coughing x 2-3 weeks.  He denies fever, but reports some increased fatigue over the last week. Pt reports he has been using his rescue inhaler 3-4 times a day.  He has also been using his nebulizer twice daily.  He uses Spiriva and advair daily.     ROS  General: (-) fever, (-) chills, (-) night sweats, (-) weakness Skin: (-) rashes, (-) itching,. Eyes: (-) visual changes, (-) redness, (-) itching. Nose and Sinuses: (-) nasal stuffiness or itchiness, (-) postnasal drip, (-) nosebleeds, (-) sinus trouble. Mouth and Throat: (-) sore throat, (-) hoarseness. Neck: (-) swollen glands, (-) enlarged thyroid, (-) neck pain. Respiratory: + cough, (-) bloody sputum, - shortness of breath, - wheezing. Cardiovascular: - ankle swelling, (-) chest pain. Lymphatic: (-) lymph node enlargement. Neurologic: (-) numbness, (-) tingling. Psychiatric: (-) anxiety, (-) depression   Current Medication: Outpatient Encounter Medications as of 12/25/2018  Medication Sig  . ADVAIR DISKUS 500-50 MCG/DOSE AEPB INHALE 1 PUFF BY MOUTH TWICE DAILY  . albuterol (PROVENTIL HFA;VENTOLIN HFA) 108 (90 Base) MCG/ACT inhaler INHALE 2 PUFFS BY MOUTH EVERY 6 HOURS IF NEEDED FOR SHORTNESS OF BREATH OR WHEEZING  . albuterol (PROVENTIL) (2.5 MG/3ML) 0.083% nebulizer solution INHALE CONTENTS OF 1 VIAL IN NEBULIZER EVERY 4 TO 6 HOURS AS NEEDED  . ALL DAY ALLERGY 10 MG tablet take 1 tablet by mouth once daily  . amLODipine (NORVASC) 5 MG tablet Take 5 mg by mouth daily. for high blood pressure  . DALIRESP 500 MCG TABS tablet TAKE 1 TABLET BY MOUTH EVERY DAY  . fluticasone (FLONASE) 50 MCG/ACT nasal spray INSTILL 2 SPRAYS INTO EACH NOSTRIL ONCE DAILY IF NEEDED FOR SINUS  CONGESTION  . meloxicam (MOBIC) 15 MG tablet Take 15 mg by mouth daily.  . metoprolol succinate (TOPROL-XL) 25 MG 24 hr tablet Take 25 mg by mouth daily.  Marland Kitchen. omeprazole (PRILOSEC) 20 MG capsule Take 20 mg by mouth daily.   . OXYGEN Inhale 2.5 L into the lungs continuous.  . predniSONE (DELTASONE) 10 MG tablet TAKE 1 TABLET(10 MG) BY MOUTH DAILY WITH BREAKFAST  . SPIRIVA HANDIHALER 18 MCG inhalation capsule INHALE THE CONTENTS OF 1 CAPSULE VIA HANDIHALER DEVICE IN 2 PUFFS EVERY DAY AS DIRECTED  . tamsulosin (FLOMAX) 0.4 MG CAPS capsule Take 1 capsule (0.4 mg total) by mouth daily after breakfast. AM   Facility-Administered Encounter Medications as of 12/25/2018  Medication  . lidocaine (XYLOCAINE) 2 % jelly 1 application    Surgical History: Past Surgical History:  Procedure Laterality Date  . CATARACT EXTRACTION W/PHACO Right 03/30/2015   Procedure: CATARACT EXTRACTION PHACO AND INTRAOCULAR LENS PLACEMENT (IOC);  Surgeon: Lockie Molahadwick Brasington, MD;  [facility];  Service: Ophthalmology;  Laterality: Right;  . CIRCUMCISION     as child  . DENTAL SURGERY      Medical History: Past Medical History:  Diagnosis Date  . Arthritis    shoulders  . Asthma   . Benign prostatic hypertrophy   . COPD (chronic obstructive pulmonary disease) (HCC)    chronic bronchitis  . GERD (gastroesophageal reflux disease)   . Hypertension   . Seasonal allergies   . Shortness of breath dyspnea   . Wears partial dentures  upper    Family History: History reviewed. No pertinent family history.  Social History: Social History   Socioeconomic History  . Marital status: Divorced    Spouse name: Not on file  . Number of children: Not on file  . Years of education: Not on file  . Highest education level: Not on file  Occupational History  . Not on file  Social Needs  . Financial resource strain: Not on file  . Food insecurity:    Worry: Not on file    Inability: Not on file  .  Transportation needs:    Medical: Not on file    Non-medical: Not on file  Tobacco Use  . Smoking status: Former Smoker    Last attempt to quit: 11/12/1984    Years since quitting: 34.1  . Smokeless tobacco: Never Used  Substance and Sexual Activity  . Alcohol use: Yes    Alcohol/week: 3.0 standard drinks    Types: 3 Shots of liquor per week    Comment: pt said "several pints per week"  . Drug use: No  . Sexual activity: Not Currently  Lifestyle  . Physical activity:    Days per week: Not on file    Minutes per session: Not on file  . Stress: Not on file  Relationships  . Social connections:    Talks on phone: Not on file    Gets together: Not on file    Attends religious service: Not on file    Active member of club or organization: Not on file    Attends meetings of clubs or organizations: Not on file    Relationship status: Not on file  . Intimate partner violence:    Fear of current or ex partner: Not on file    Emotionally abused: Not on file    Physically abused: Not on file    Forced sexual activity: Not on file  Other Topics Concern  . Not on file  Social History Narrative  . Not on file    Vital Signs: Blood pressure 136/78, pulse 66, resp. rate 16, height 6' 3.5" (1.918 m), weight 141 lb (64 kg), SpO2 93 %.  Examination: General Appearance: The patient is well-developed, well-nourished, and in no distress. Skin: Gross inspection of skin unremarkable. Head: normocephalic, no gross deformities. Eyes: no gross deformities noted. ENT: ears appear grossly normal no exudates. Neck: Supple. No thyromegaly. No LAD. Respiratory: Faint expiratory wheeze. Cardiovascular: Normal S1 and S2 without murmur or rub. Extremities: No cyanosis. pulses are equal. Neurologic: Alert and oriented. No involuntary movements.  LABS: No results found for this or any previous visit (from the past 2160 hour(s)).  Radiology: No results found.  No results found.  No results  found.    Assessment and Plan: Patient Active Problem List   Diagnosis Date Noted  . Substance induced mood disorder (HCC) 08/14/2018  . Psychosis (HCC) 07/17/2018  . Cocaine abuse (HCC) 07/17/2018  . Violent behavior 07/17/2018  . Acute respiratory failure (HCC) 03/31/2018  . Microscopic hematuria 12/19/2017  . Pneumonia 11/26/2017  . Chronic respiratory failure with hypoxia (HCC) 11/26/2017  . Candidal stomatitis 11/26/2017  . Atelectasis 11/26/2017  . Solitary pulmonary nodule 11/26/2017  . Panlobular emphysema (HCC) 11/26/2017  . Chronic obstructive pulmonary disease with acute exacerbation (HCC) 11/26/2017  . 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 Hemoptysis 11/26/2017  . Shortness of breath 11/26/2017  . Hypertension, essential 11/26/2017  . Benign prostatic hyperplasia with lower urinary tract symptoms 03/12/2014  1. Chronic obstructive pulmonary disease, unspecified COPD type (HCC) Patient continues to report difficulty breathing especially with exertion.  He is using his rescue inhaler and nebulizer multiple today.  He reports he is using Spiriva and Advair daily with mild symptom relief.  Patient states he cannot do without it even 1 day.  He is requesting samples of any of these medications.  He is not a pulmonary function test in over a year so that is ordered at this time especially given his increasing symptoms.  Due to length patient's cough a azithromycin course as prescribed.  Instructed patient to take all medications until completion and return to clinic if symptoms fail to improve. - Pulmonary Function Test; Future -Azithromycicn 250mg  po #6, no refills.   2. Wheezing Given sample of Spiriva Respimat as patient has difficulty with Spiriva capsules in the HandiHaler.  3. SOB (shortness of breath) Spirometry today exhibits severe disease. FVC 1.9 which is 41% of the pre-predicted value FEV1 0.7 which is 20% of the pre-predicted  value FEV1/FVC is 38% which is 50% of the pre-predicted value.  The numbers are all slightly decrease since last spirometry 6 months ago. - Spirometry with Graph  4. Hypertension, essential Pts blood pressure stable at this time. Continue current medications as directed.   General Counseling: I have discussed the findings of the evaluation and examination with Jemel.  I have also discussed any further diagnostic evaluation thatmay be needed or ordered today. Judah verbalizes understanding of the findings of todays visit. We also reviewed his medications today and discussed drug interactions and side effects including but not limited excessive drowsiness and altered mental states. We also discussed that there is always a risk not just to him but also people around him. he has been encouraged to call the office with any questions or concerns that should arise related to todays visit.  Given during LetterTime spent: 25 This patient was seen by Blima Ledger AGNP-C in Collaboration with Dr. Freda Munro as a part of collaborative care agreement.   I have personally obtained a history, examined the patient, evaluated laboratory and imaging results, formulated the assessment and plan and placed orders.    Yevonne Pax, MD [facility] Pulmonary and Critical Care Sleep medicine

## 2018-12-25 NOTE — Patient Instructions (Signed)
Chronic Obstructive Pulmonary Disease Chronic obstructive pulmonary disease (COPD) is a long-term (chronic) lung problem. When you have COPD, it is hard for air to get in and out of your lungs. Usually the condition gets worse over time, and your lungs will never return to normal. There are things you can do to keep yourself as healthy as possible.  Your doctor may treat your condition with: ? Medicines. ? Oxygen. ? Lung surgery.  Your doctor may also recommend: ? Rehabilitation. This includes steps to make your body work better. It may involve a team of specialists. ? Quitting smoking, if you smoke. ? Exercise and changes to your diet. ? Comfort measures (palliative care). Follow these instructions at home: Medicines  Take over-the-counter and prescription medicines only as told by your doctor.  Talk to your doctor before taking any cough or allergy medicines. You may need to avoid medicines that cause your lungs to be dry. Lifestyle  If you smoke, stop. Smoking makes the problem worse. If you need help quitting, ask your doctor.  Avoid being around things that make your breathing worse. This may include smoke, chemicals, and fumes.  Stay active, but remember to rest as well.  Learn and use tips on how to relax.  Make sure you get enough sleep. Most adults need at least 7 hours of sleep every night.  Eat healthy foods. Eat smaller meals more often. Rest before meals. Controlled breathing Learn and use tips on how to control your breathing as told by your doctor. Try:  Breathing in (inhaling) through your nose for 1 second. Then, pucker your lips and breath out (exhale) through your lips for 2 seconds.  Putting one hand on your belly (abdomen). Breathe in slowly through your nose for 1 second. Your hand on your belly should move out. Pucker your lips and breathe out slowly through your lips. Your hand on your belly should move in as you breathe out.  Controlled coughing Learn  and use controlled coughing to clear mucus from your lungs. Follow these steps: 1. Lean your head a little forward. 2. Breathe in deeply. 3. Try to hold your breath for 3 seconds. 4. Keep your mouth slightly open while coughing 2 times. 5. Spit any mucus out into a tissue. 6. Rest and do the steps again 1 or 2 times as needed. General instructions  Make sure you get all the shots (vaccines) that your doctor recommends. Ask your doctor about a flu shot and a pneumonia shot.  Use oxygen therapy and pulmonary rehabilitation if told by your doctor. If you need home oxygen therapy, ask your doctor if you should buy a tool to measure your oxygen level (oximeter).  Make a COPD action plan with your doctor. This helps you to know what to do if you feel worse than usual.  Manage any other conditions you have as told by your doctor.  Avoid going outside when it is very hot, cold, or humid.  Avoid people who have a sickness you can catch (contagious).  Keep all follow-up visits as told by your doctor. This is important. Contact a doctor if:  You cough up more mucus than usual.  There is a change in the color or thickness of the mucus.  It is harder to breathe than usual.  Your breathing is faster than usual.  You have trouble sleeping.  You need to use your medicines more often than usual.  You have trouble doing your normal activities such as getting dressed   or walking around the house. Get help right away if:  You have shortness of breath while resting.  You have shortness of breath that stops you from: ? Being able to talk. ? Doing normal activities.  Your chest hurts for longer than 5 minutes.  Your skin color is more blue than usual.  Your pulse oximeter shows that you have low oxygen for longer than 5 minutes.  You have a fever.  You feel too tired to breathe normally. Summary  Chronic obstructive pulmonary disease (COPD) is a long-term lung problem.  The way your  lungs work will never return to normal. Usually the condition gets worse over time. There are things you can do to keep yourself as healthy as possible.  Take over-the-counter and prescription medicines only as told by your doctor.  If you smoke, stop. Smoking makes the problem worse. This information is not intended to replace advice given to you by your health care provider. Make sure you discuss any questions you have with your health care provider. Document Released: 04/16/2008 Document Revised: 12/03/2016 Document Reviewed: 12/03/2016 Elsevier Interactive Patient Education  2019 [employer] Inc.  

## 2018-12-28 ENCOUNTER — Other Ambulatory Visit: Payer: Self-pay | Admitting: Adult Health

## 2018-12-28 NOTE — Addendum Note (Signed)
Addended by: Freda Munro on: 12/28/2018 09:59 PM   Modules accepted: Level of Service

## 2018-12-31 ENCOUNTER — Ambulatory Visit (INDEPENDENT_AMBULATORY_CARE_PROVIDER_SITE_OTHER): Payer: Medicare Other | Admitting: Internal Medicine

## 2018-12-31 DIAGNOSIS — R0602 Shortness of breath: Secondary | ICD-10-CM | POA: Diagnosis not present

## 2018-12-31 LAB — PULMONARY FUNCTION TEST

## 2019-01-07 NOTE — Procedures (Signed)
[facility] [address] [place], [address]  DATE OF SERVICE: December 31, 2018  Complete Pulmonary Function Testing Interpretation:  FINDINGS:  The forced vital capacity is severely decreased.  The FEV1 is 0.58 L which is 18% of predicted and is severely decreased.  Postbronchodilator there is no significant change in the FEV1.  FEV1 FVC ratio is severely decreased.  Total lung capacity is moderately decreased residual volume is normal residual volume total lung capacity ratio is increased the DLCO is severely decreased.  IMPRESSION:  This pulmonary function study is consistent with severe obstructive lung disease.  DLCO severely decreased.  Clinical correlation is recommended  Yevonne Pax, MD [facility] Pulmonary Critical Care Medicine Sleep Medicine

## 2019-01-12 ENCOUNTER — Other Ambulatory Visit: Payer: Self-pay | Admitting: Internal Medicine

## 2019-01-12 ENCOUNTER — Other Ambulatory Visit: Payer: Self-pay | Admitting: Adult Health

## 2019-01-14 ENCOUNTER — Ambulatory Visit: Payer: Medicare Other | Admitting: Urology

## 2019-02-08 ENCOUNTER — Other Ambulatory Visit: Payer: Self-pay | Admitting: Adult Health

## 2019-02-23 ENCOUNTER — Other Ambulatory Visit: Payer: Self-pay

## 2019-02-23 MED ORDER — ADVAIR DISKUS 500-50 MCG/DOSE IN AEPB: 1.0000 | INHALATION_SPRAY | Freq: Two times a day (BID) | RESPIRATORY_TRACT | 3 refills | Status: DC

## 2019-03-06 ENCOUNTER — Other Ambulatory Visit: Payer: Self-pay | Admitting: Adult Health

## 2019-03-10 ENCOUNTER — Telehealth: Payer: Self-pay | Admitting: Internal Medicine

## 2019-03-10 NOTE — Telephone Encounter (Signed)
Vonita Moss (Key: [id number]) Rx #: [id number] Spiriva HandiHaler capsules Prior authorization has been approved  Renly Debaun (Key: [id number]) - [id number] Spiriva HandiHaler capsules Outcome: Approved

## 2019-03-24 ENCOUNTER — Other Ambulatory Visit: Payer: Self-pay | Admitting: Adult Health

## 2019-04-07 ENCOUNTER — Other Ambulatory Visit: Payer: Self-pay | Admitting: Internal Medicine

## 2019-04-09 ENCOUNTER — Other Ambulatory Visit: Payer: Self-pay | Admitting: *Deleted

## 2019-04-09 MED ORDER — TAMSULOSIN HCL 0.4 MG PO CAPS: 0.4000 mg | ORAL_CAPSULE | Freq: Every day | ORAL | 3 refills | Status: AC

## 2019-04-18 ENCOUNTER — Other Ambulatory Visit: Payer: Self-pay | Admitting: Adult Health

## 2019-05-21 ENCOUNTER — Other Ambulatory Visit: Payer: Self-pay | Admitting: Internal Medicine

## 2019-05-25 ENCOUNTER — Ambulatory Visit: Payer: Self-pay | Admitting: Adult Health

## 2019-06-21 ENCOUNTER — Other Ambulatory Visit: Payer: Self-pay | Admitting: Adult Health

## 2019-06-24 ENCOUNTER — Other Ambulatory Visit: Payer: Self-pay | Admitting: Adult Health

## 2019-07-08 ENCOUNTER — Other Ambulatory Visit: Payer: Self-pay | Admitting: Adult Health

## 2019-07-08 NOTE — Telephone Encounter (Signed)
Pt need appt for further refills.

## 2019-08-18 ENCOUNTER — Other Ambulatory Visit: Payer: Self-pay | Admitting: Adult Health

## 2019-08-18 ENCOUNTER — Telehealth: Payer: Self-pay

## 2019-08-18 MED ORDER — PREDNISONE 10 MG PO TABS: ORAL_TABLET | ORAL | 0 refills | Status: DC

## 2019-08-18 NOTE — Telephone Encounter (Signed)
Pt advised need appt for further refills  

## 2019-09-06 ENCOUNTER — Other Ambulatory Visit: Payer: Self-pay | Admitting: Adult Health

## 2019-09-15 ENCOUNTER — Ambulatory Visit (INDEPENDENT_AMBULATORY_CARE_PROVIDER_SITE_OTHER): Payer: Medicare Other | Admitting: Internal Medicine

## 2019-09-15 ENCOUNTER — Other Ambulatory Visit: Payer: Self-pay

## 2019-09-15 ENCOUNTER — Encounter: Payer: Self-pay | Admitting: Internal Medicine

## 2019-09-15 VITALS — BP 148/86 | HR 70 | Temp 97.4°F | Resp 16 | Ht 75.0 in | Wt 128.6 lb

## 2019-09-15 DIAGNOSIS — F1410 Cocaine abuse, uncomplicated: Secondary | ICD-10-CM

## 2019-09-15 DIAGNOSIS — J449 Chronic obstructive pulmonary disease, unspecified: Secondary | ICD-10-CM

## 2019-09-15 DIAGNOSIS — R0602 Shortness of breath: Secondary | ICD-10-CM

## 2019-09-15 NOTE — Patient Instructions (Signed)
Chronic Obstructive Pulmonary Disease °Chronic obstructive pulmonary disease (COPD) is a long-term (chronic) lung problem. When you have COPD, it is hard for air to get in and out of your lungs. Usually the condition gets worse over time, and your lungs will never return to normal. There are things you can do to keep yourself as healthy as possible. °· Your doctor may treat your condition with: °? Medicines. °? Oxygen. °? Lung surgery. °· Your doctor may also recommend: °? Rehabilitation. This includes steps to make your body work better. It may involve a team of specialists. °? Quitting smoking, if you smoke. °? Exercise and changes to your diet. °? Comfort measures (palliative care). °Follow these instructions at home: °Medicines °· Take over-the-counter and prescription medicines only as told by your doctor. °· Talk to your doctor before taking any cough or allergy medicines. You may need to avoid medicines that cause your lungs to be dry. °Lifestyle °· If you smoke, stop. Smoking makes the problem worse. If you need help quitting, ask your doctor. °· Avoid being around things that make your breathing worse. This may include smoke, chemicals, and fumes. °· Stay active, but remember to rest as well. °· Learn and use tips on how to relax. °· Make sure you get enough sleep. Most adults need at least 7 hours of sleep every night. °· Eat healthy foods. Eat smaller meals more often. Rest before meals. °Controlled breathing °Learn and use tips on how to control your breathing as told by your doctor. Try: °· Breathing in (inhaling) through your nose for 1 second. Then, pucker your lips and breath out (exhale) through your lips for 2 seconds. °· Putting one hand on your belly (abdomen). Breathe in slowly through your nose for 1 second. Your hand on your belly should move out. Pucker your lips and breathe out slowly through your lips. Your hand on your belly should move in as you breathe out. ° °Controlled coughing °Learn  and use controlled coughing to clear mucus from your lungs. Follow these steps: °1. Lean your head a little forward. °2. Breathe in deeply. °3. Try to hold your breath for 3 seconds. °4. Keep your mouth slightly open while coughing 2 times. °5. Spit any mucus out into a tissue. °6. Rest and do the steps again 1 or 2 times as needed. °General instructions °· Make sure you get all the shots (vaccines) that your doctor recommends. Ask your doctor about a flu shot and a pneumonia shot. °· Use oxygen therapy and pulmonary rehabilitation if told by your doctor. If you need home oxygen therapy, ask your doctor if you should buy a tool to measure your oxygen level (oximeter). °· Make a COPD action plan with your doctor. This helps you to know what to do if you feel worse than usual. °· Manage any other conditions you have as told by your doctor. °· Avoid going outside when it is very hot, cold, or humid. °· Avoid people who have a sickness you can catch (contagious). °· Keep all follow-up visits as told by your doctor. This is important. °Contact a doctor if: °· You cough up more mucus than usual. °· There is a change in the color or thickness of the mucus. °· It is harder to breathe than usual. °· Your breathing is faster than usual. °· You have trouble sleeping. °· You need to use your medicines more often than usual. °· You have trouble doing your normal activities such as getting dressed   or walking around the house. °Get help right away if: °· You have shortness of breath while resting. °· You have shortness of breath that stops you from: °? Being able to talk. °? Doing normal activities. °· Your chest hurts for longer than 5 minutes. °· Your skin color is more blue than usual. °· Your pulse oximeter shows that you have low oxygen for longer than 5 minutes. °· You have a fever. °· You feel too tired to breathe normally. °Summary °· Chronic obstructive pulmonary disease (COPD) is a long-term lung problem. °· The way your  lungs work will never return to normal. Usually the condition gets worse over time. There are things you can do to keep yourself as healthy as possible. °· Take over-the-counter and prescription medicines only as told by your doctor. °· If you smoke, stop. Smoking makes the problem worse. °This information is not intended to replace advice given to you by your health care provider. Make sure you discuss any questions you have with your health care provider. °Document Released: 04/16/2008 Document Revised: 10/11/2017 Document Reviewed: 12/03/2016 °Elsevier Patient Education © 2020 Elsevier Inc. ° °

## 2019-09-15 NOTE — Progress Notes (Signed)
[facility] [place], [place] [address]  Pulmonary Sleep Medicine   Office Visit Note  Patient Name: Frank Todd. DOB: 10/20/1951 MRN [id number]  Date of Service: 09/15/2019  Complaints/HPI: Patient is here for follow-up has been doing well with his COPD unfortunately continues to smoke also is using cocaine on occasion.  Very lengthy discussion with him regarding the use of drugs at this point he needs to quit using cocaine secondary to his underlying health needs.  Patient is using the marijuana on occasion also again counseling was provided.  He still gets short of breath.  He was having some issues with his oxygen machine which is now better patient also did not have any samples so we provided him some samples of Trelegy  ROS  General: (-) fever, (-) chills, (-) night sweats, (-) weakness Skin: (-) rashes, (-) itching,. Eyes: (-) visual changes, (-) redness, (-) itching. Nose and Sinuses: (-) nasal stuffiness or itchiness, (-) postnasal drip, (-) nosebleeds, (-) sinus trouble. Mouth and Throat: (-) sore throat, (-) hoarseness. Neck: (-) swollen glands, (-) enlarged thyroid, (-) neck pain. Respiratory: + cough, (-) bloody sputum, + shortness of breath, - wheezing. Cardiovascular: - ankle swelling, (-) chest pain. Lymphatic: (-) lymph node enlargement. Neurologic: (-) numbness, (-) tingling. Psychiatric: (-) anxiety, (-) depression   Current Medication: Outpatient Encounter Medications as of 09/15/2019  Medication Sig  . ADVAIR DISKUS 500-50 MCG/DOSE AEPB INHALE 1 PUFF INTO THE LUNGS TWICE DAILY  . albuterol (PROVENTIL) (2.5 MG/3ML) 0.083% nebulizer solution USE 1 VIAL VIA NEBULIZER EVERY 4 TO 6 HOURS AS NEEDED  . albuterol (VENTOLIN HFA) 108 (90 Base) MCG/ACT inhaler INHALE 2 PUFFS BY MOUTH EVERY 6 HOURS AS NEEDED FOR WHEEZING OR SHORTNESS OF BREATH  . amLODipine (NORVASC) 5 MG tablet Take 5 mg by mouth daily. for high blood pressure  . azithromycin  (ZITHROMAX) 250 MG tablet take as directed  . cetirizine (ZYRTEC) 10 MG tablet TAKE 1 TABLET BY MOUTH EVERY DAY  . DALIRESP 500 MCG TABS tablet TAKE 1 TABLET BY MOUTH EVERY DAY  . fluticasone (FLONASE) 50 MCG/ACT nasal spray INSTILL 2 SPRAYS INTO EACH NOSTRIL ONCE DAILY IF NEEDED FOR SINUS CONGESTION  . meloxicam (MOBIC) 15 MG tablet Take 15 mg by mouth daily.  . metoprolol succinate (TOPROL-XL) 25 MG 24 hr tablet Take 25 mg by mouth daily.  Marland Kitchen omeprazole (PRILOSEC) 20 MG capsule Take 20 mg by mouth daily.   . OXYGEN Inhale 2.5 L into the lungs continuous.  . predniSONE (DELTASONE) 10 MG tablet TAKE 1 TABLET(10 MG) BY MOUTH DAILY WITH BREAKFAST  . SPIRIVA HANDIHALER 18 MCG inhalation capsule INHALE 2 PUFFS EVERY DAY AS DIRECTED  . tamsulosin (FLOMAX) 0.4 MG CAPS capsule Take 1 capsule (0.4 mg total) by mouth daily after breakfast. AM   Facility-Administered Encounter Medications as of 09/15/2019  Medication  . lidocaine (XYLOCAINE) 2 % jelly 1 application    Surgical History: Past Surgical History:  Procedure Laterality Date  . CATARACT EXTRACTION W/PHACO Right 03/30/2015   Procedure: CATARACT EXTRACTION PHACO AND INTRAOCULAR LENS PLACEMENT (IOC);  Surgeon: Leandrew Koyanagi, MD;  Location: [place];  Service: Ophthalmology;  Laterality: Right;  . CIRCUMCISION     as child  . DENTAL SURGERY      Medical History: Past Medical History:  Diagnosis Date  . Arthritis    shoulders  . Asthma   . Benign prostatic hypertrophy   . COPD (chronic obstructive pulmonary disease) ([place])  chronic bronchitis  . GERD (gastroesophageal reflux disease)   . Hypertension   . Seasonal allergies   . Shortness of breath dyspnea   . Wears partial dentures    upper    Family History: History reviewed. No pertinent family history.  Social History: Social History   Socioeconomic History  . Marital status: Divorced    Spouse name: Not on file  . Number of children: Not on file  .  Years of education: Not on file  . Highest education level: Not on file  Occupational History  . Not on file  Social Needs  . Financial resource strain: Not on file  . Food insecurity    Worry: Not on file    Inability: Not on file  . Transportation needs    Medical: Not on file    Non-medical: Not on file  Tobacco Use  . Smoking status: Former Smoker    Quit date: 11/12/1984    Years since quitting: 34.8  . Smokeless tobacco: Never Used  Substance and Sexual Activity  . Alcohol use: Yes    Alcohol/week: 3.0 standard drinks    Types: 3 Shots of liquor per week    Comment: pt said "several pints per week"  . Drug use: No  . Sexual activity: Not Currently  Lifestyle  . Physical activity    Days per week: Not on file    Minutes per session: Not on file  . Stress: Not on file  Relationships  . Social Musician on phone: Not on file    Gets together: Not on file    Attends religious service: Not on file    Active member of club or organization: Not on file    Attends meetings of clubs or organizations: Not on file    Relationship status: Not on file  . Intimate partner violence    Fear of current or ex partner: Not on file    Emotionally abused: Not on file    Physically abused: Not on file    Forced sexual activity: Not on file  Other Topics Concern  . Not on file  Social History Narrative  . Not on file    Vital Signs: Blood pressure (!) 148/86, pulse 70, temperature (!) 97.4 F (36.3 C), resp. rate 16, height 6\' 3"  (1.905 m), weight 128 lb 9.6 oz (58.3 kg), SpO2 93 %.  Examination: General Appearance: The patient is well-developed, well-nourished, and in no distress. Skin: Gross inspection of skin unremarkable. Head: normocephalic, no gross deformities. Eyes: no gross deformities noted. ENT: ears appear grossly normal no exudates. Neck: Supple. No thyromegaly. No LAD. Respiratory: no rhonchi noted. Cardiovascular: Normal S1 and S2 without murmur or  rub. Extremities: No cyanosis. pulses are equal. Neurologic: Alert and oriented. No involuntary movements.  LABS: No results found for this or any previous visit (from the past 2160 hour(s)).  Radiology: No results found.  No results found.  No results found.    Assessment and Plan: Patient Active Problem List   Diagnosis Date Noted  . Substance induced mood disorder (HCC) 08/14/2018  . Psychosis (HCC) 07/17/2018  . Cocaine abuse (HCC) 07/17/2018  . Violent behavior 07/17/2018  . Acute respiratory failure (HCC) 03/31/2018  . Microscopic hematuria 12/19/2017  . Pneumonia 11/26/2017  . Chronic respiratory failure with hypoxia (HCC) 11/26/2017  . Candidal stomatitis 11/26/2017  . Atelectasis 11/26/2017  . Solitary pulmonary nodule 11/26/2017  . Panlobular emphysema (HCC) 11/26/2017  . Chronic obstructive pulmonary  disease with acute exacerbation (HCC) 11/26/2017  . 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 Hemoptysis 11/26/2017  . Shortness of breath 11/26/2017  . Hypertension, essential 11/26/2017  . Benign prostatic hyperplasia with lower urinary tract symptoms 03/12/2014    1. COPD/Emphysema severe decline in his last FEV1 was 18% despite this he continues to smoke counseling provided at length.  He needs to continue with his inhalers which he did not have so therefore we gave him samples of Trelegy 2. Smoking smoking cessation counseling was again provided for cigarettes as well as marijuana 3. Drug Abuse mainly cocaine he says on occasion but counseling was provided  should stop using  General Counseling: I have discussed the findings of the evaluation and examination with Celene Kraslaude.  I have also discussed any further diagnostic evaluation thatmay be needed or ordered today. Judy verbalizes understanding of the findings of todays visit. We also reviewed his medications today and discussed drug interactions and side effects including but not limited  excessive drowsiness and altered mental states. We also discussed that there is always a risk not just to him but also people around him. he has been encouraged to call the office with any questions or concerns that should arise related to todays visit.    Time spent: 15min  I have personally obtained a history, examined the patient, evaluated laboratory and imaging results, formulated the assessment and plan and placed orders.    Yevonne PaxSaadat A Susumu Hackler, MD [facility] Pulmonary and Critical Care Sleep medicine

## 2019-09-22 ENCOUNTER — Other Ambulatory Visit: Payer: Self-pay | Admitting: Adult Health

## 2019-10-09 ENCOUNTER — Other Ambulatory Visit: Payer: Self-pay | Admitting: Adult Health

## 2019-10-09 DIAGNOSIS — J449 Chronic obstructive pulmonary disease, unspecified: Secondary | ICD-10-CM

## 2019-10-11 ENCOUNTER — Emergency Department
Admission: EM | Admit: 2019-10-11 | Discharge: 2019-10-11 | Disposition: A | Discharge status: Home / Self Care | Payer: Medicare Other | Attending: Family Medicine | Admitting: Family Medicine

## 2019-10-11 ENCOUNTER — Encounter: Payer: Self-pay | Admitting: Emergency Medicine

## 2019-10-11 DIAGNOSIS — M109 Gout, unspecified: Secondary | ICD-10-CM | POA: Insufficient documentation

## 2019-10-11 DIAGNOSIS — M79674 Pain in right toe(s): Secondary | ICD-10-CM | POA: Diagnosis present

## 2019-10-11 DIAGNOSIS — R339 Retention of urine, unspecified: Secondary | ICD-10-CM

## 2019-10-11 LAB — URINALYSIS, COMPLETE (UACMP) WITH MICROSCOPIC
Bilirubin Urine: NEGATIVE
Glucose, UA: NEGATIVE mg/dL
Ketones, ur: 5 mg/dL — AB
Leukocytes,Ua: NEGATIVE
Nitrite: NEGATIVE
Protein, ur: NEGATIVE mg/dL
Specific Gravity, Urine: 1.006 (ref 1.005–1.030)
Squamous Epithelial / HPF: NONE SEEN (ref 0–5)
pH: 7 (ref 5.0–8.0)

## 2019-10-11 LAB — CBC
HCT: 41.9 % (ref 39.0–52.0)
Hemoglobin: 13.2 g/dL (ref 13.0–17.0)
MCH: 26.7 pg (ref 26.0–34.0)
MCHC: 31.5 g/dL (ref 30.0–36.0)
MCV: 84.8 fL (ref 80.0–100.0)
Platelets: 248 10*3/uL (ref 150–400)
RBC: 4.94 MIL/uL (ref 4.22–5.81)
RDW: 14 % (ref 11.5–15.5)
WBC: 9.7 10*3/uL (ref 4.0–10.5)
nRBC: 0 % (ref 0.0–0.2)

## 2019-10-11 LAB — BASIC METABOLIC PANEL
Anion gap: 13 (ref 5–15)
BUN: 16 mg/dL (ref 8–23)
CO2: 28 mmol/L (ref 22–32)
Calcium: 9.1 mg/dL (ref 8.9–10.3)
Chloride: 96 mmol/L — ABNORMAL LOW (ref 98–111)
Creatinine, Ser: 1.17 mg/dL (ref 0.61–1.24)
GFR calc Af Amer: >60 mL/min (ref >60)
GFR calc non Af Amer: >60 mL/min (ref >60)
Glucose, Bld: 91 mg/dL (ref 70–99)
Potassium: 4.6 mmol/L (ref 3.5–5.1)
Sodium: 137 mmol/L (ref 135–145)

## 2019-10-11 NOTE — ED Notes (Signed)
Pt given peanut butter and crackers, ice cream and ice water.

## 2019-10-11 NOTE — ED Provider Notes (Signed)
[facility] Emergency Department Provider Note   ____________________________________________    I have reviewed the triage vital signs and the nursing notes.   HISTORY  Chief Complaint Dysuria and Groin Pain     HPI Frank RecordsClaude H Bastos Jr. is a 68 y.o. male with a history of BPH who presents with urinary retention.  Patient reports he noticed increased difficulty urinating over the last 24 hours, he took his Flomax which helped but today he started to have severe difficulty, he is able to get small amounts out but has severe spasms in his bladder which feels very full.  He notes this is happened before and he was required catheter.  Denies fevers or chills.  Past Medical History:  Diagnosis Date  . Arthritis    shoulders  . Asthma   . Benign prostatic hypertrophy   . COPD (chronic obstructive pulmonary disease) (HCC)    chronic bronchitis  . GERD (gastroesophageal reflux disease)   . Hypertension   . Seasonal allergies   . Shortness of breath dyspnea   . Wears partial dentures    upper    Patient Active Problem List   Diagnosis Date Noted  . Substance induced mood disorder (HCC) 08/14/2018  . Psychosis (HCC) 07/17/2018  . Cocaine abuse (HCC) 07/17/2018  . Violent behavior 07/17/2018  . Acute respiratory failure (HCC) 03/31/2018  . Microscopic hematuria 12/19/2017  . Pneumonia 11/26/2017  . Chronic respiratory failure with hypoxia (HCC) 11/26/2017  . Candidal stomatitis 11/26/2017  . Atelectasis 11/26/2017  . Solitary pulmonary nodule 11/26/2017  . Panlobular emphysema (HCC) 11/26/2017  . Chronic obstructive pulmonary disease with acute exacerbation (HCC) 11/26/2017  . 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 Hemoptysis 11/26/2017  . Shortness of breath 11/26/2017  . Hypertension, essential 11/26/2017  . Benign prostatic hyperplasia with lower urinary tract symptoms 03/12/2014    Past Surgical History:  Procedure  Laterality Date  . CATARACT EXTRACTION W/PHACO Right 03/30/2015   Procedure: CATARACT EXTRACTION PHACO AND INTRAOCULAR LENS PLACEMENT (IOC);  Surgeon: Lockie Molahadwick Brasington, MD;  [facility];  Service: Ophthalmology;  Laterality: Right;  . CIRCUMCISION     as child  . DENTAL SURGERY      Prior to Admission medications   Medication Sig Start Date End Date Taking? Authorizing Provider  ADVAIR DISKUS 500-50 MCG/DOSE AEPB INHALE 1 PUFF INTO THE LUNGS TWICE DAILY 10/11/19   Lyndon CodeKhan, Fozia M, MD  albuterol (PROVENTIL) (2.5 MG/3ML) 0.083% nebulizer solution USE 1 VIAL VIA NEBULIZER EVERY 4 TO 6 HOURS AS NEEDED 09/07/19   Johnna AcostaScarboro, Adam J, NP  albuterol (VENTOLIN HFA) 108 (90 Base) MCG/ACT inhaler INHALE 2 PUFFS BY MOUTH EVERY 6 HOURS AS NEEDED FOR WHEEZING OR SHORTNESS OF BREATH 06/21/19   Johnna AcostaScarboro, Adam J, NP  amLODipine (NORVASC) 5 MG tablet Take 5 mg by mouth daily. for high blood pressure 04/02/18   [provider]  azithromycin (ZITHROMAX) 250 MG tablet take as directed 12/25/18   Johnna AcostaScarboro, Adam J, NP  cetirizine (ZYRTEC) 10 MG tablet TAKE 1 TABLET BY MOUTH EVERY DAY 09/07/19   Johnna AcostaScarboro, Adam J, NP  DALIRESP 500 MCG TABS tablet TAKE 1 TABLET BY MOUTH EVERY DAY 10/11/19   Lyndon CodeKhan, Fozia M, MD  fluticasone [facility](FLONASE) 50 MCG/ACT nasal spray INSTILL 2 SPRAYS INTO EACH NOSTRIL ONCE DAILY IF NEEDED FOR SINUS CONGESTION 10/06/18   Johnna AcostaScarboro, Adam J, NP  meloxicam (MOBIC) 15 MG tablet Take 15 mg by mouth daily. 03/09/18   [provider]  metoprolol succinate (TOPROL-XL) 25 MG 24 hr tablet Take 25 mg by mouth daily. 02/19/18   [provider]  omeprazole (PRILOSEC) 20 MG capsule Take 20 mg by mouth daily.     [provider]  OXYGEN Inhale 2.5 L into the lungs continuous.    [provider]  predniSONE (DELTASONE) 10 MG tablet TAKE 1 TABLET(10 MG) BY MOUTH DAILY WITH BREAKFAST 10/11/19   Lyndon Code, MD  SPIRIVA HANDIHALER 18 MCG inhalation capsule INHALE  2 PUFFS EVERY DAY AS DIRECTED 09/22/19   Lyndon Code, MD  tamsulosin [facility]) 0.4 MG CAPS capsule Take 1 capsule (0.4 mg total) by mouth daily after breakfast. AM 04/09/19   Stoioff, Verna Czech, MD     Allergies Patient has no known allergies.  History reviewed. No pertinent family history.  Social History Social History   Tobacco Use  . Smoking status: Former Smoker    Quit date: 11/12/1984    Years since quitting: 34.9  . Smokeless tobacco: Never Used  Substance Use Topics  . Alcohol use: Yes    Alcohol/week: 3.0 standard drinks    Types: 3 Shots of liquor per week    Comment: pt said "several pints per week"  . Drug use: No    Review of Systems  Constitutional: No fever/chills Eyes: No visual changes.  ENT: No sore throat. Cardiovascular: Denies chest pain. Respiratory: Denies shortness of breath. Gastrointestinal: As above Genitourinary: As above Musculoskeletal: Negative for back pain. Skin: Negative for rash. Neurological: Negative for headaches   ____________________________________________   PHYSICAL EXAM:  VITAL SIGNS: ED Triage Vitals  Enc Vitals Group     BP 10/11/19 1449 (!) 160/101     Pulse Rate 10/11/19 1449 94     Resp 10/11/19 1449 (!) 24     Temp 10/11/19 1449 98.3 F (36.8 C)     Temp Source 10/11/19 1449 Oral     SpO2 10/11/19 1444 99 %     Weight 10/11/19 1451 66.2 kg (146 lb)     Height 10/11/19 1451 1.905 m (6\' 3" )     Head Circumference --      Peak Flow --      Pain Score 10/11/19 1450 7     Pain Loc --      Pain Edu? --      Excl. in GC? --     Constitutional: Alert and oriented.   Nose: No congestion/rhinnorhea. Mouth/Throat: Mucous membranes are moist.    Cardiovascular: Normal rate, regular rhythm. Grossly normal heart sounds.  Good peripheral circulation. Respiratory: Normal respiratory effort.  No retractions. Lungs CTAB. Gastrointestinal: Soft, tenderness over the suprapubic region firm bladder Genitourinary: deferred   Neurologic:  Normal speech and language. No gross focal neurologic deficits are appreciated.  Skin:  Skin is warm, dry and intact. No rash noted. Psychiatric: Mood and affect are normal. Speech and behavior are normal.  ____________________________________________   LABS (all labs ordered are listed, but only abnormal results are displayed)  Labs Reviewed  URINALYSIS, COMPLETE (UACMP) WITH MICROSCOPIC - Abnormal; Notable for the following components:      Result Value   Color, Urine STRAW (*)    APPearance CLEAR (*)    Hgb urine dipstick SMALL (*)    Ketones, ur 5 (*)    Bacteria, UA MANY (*)    All other components within normal limits  BASIC METABOLIC PANEL - Abnormal; Notable for the following components:   Chloride 96 (*)  All other components within normal limits  CBC   ____________________________________________  EKG  ED ECG REPORT I, Lavonia Drafts, the attending physician, personally viewed and interpreted this ECG.  Date: 10/11/2019  Rhythm: Tachycardia QRS Axis: normal Intervals: normal ST/T Wave abnormalities: Nonspecific change Narrative Interpretation: no evidence of acute ischemia  ____________________________________________  RADIOLOGY  None ____________________________________________   PROCEDURES  Procedure(s) performed: No  Procedures   Critical Care performed: No ____________________________________________   INITIAL IMPRESSION / ASSESSMENT AND PLAN / ED COURSE  Pertinent labs & imaging results that were available during my care of the patient were reviewed by me and considered in my medical decision making (see chart for details).  Patient HPI and exam consistent with acute urinary retention, Foley catheter placed with immediate relief of his discomfort.  Lab work urinalysis unremarkable, he will follow-up with urology this week    ____________________________________________   FINAL CLINICAL IMPRESSION(S) / ED DIAGNOSES   Final diagnoses:  Urinary retention        Note:  This document was prepared using Dragon voice recognition software and may include unintentional dictation errors.   Lavonia Drafts, MD 10/11/19 2318

## 2019-10-11 NOTE — ED Notes (Signed)
Pt to lobby in wheelchair with EDT. Verbalized understanding of discharge instructions and follow-up care.

## 2019-10-11 NOTE — ED Notes (Signed)
Pt states wears 2.5L of O2 "most of the time". Pt currently 94 % on RA. Pt has hx of COPD.

## 2019-10-11 NOTE — ED Triage Notes (Signed)
Pt to ED from home by EMS with c/o of groin pain and difficulty urinating. Pt had consult with urologist approx 1 year ago and surgery was recommended for enlarged prostate but pt refused. Pt states has been unable to urinate for approx 24 hrs. Pt states has had similar episodes in the past.

## 2019-10-11 NOTE — ED Notes (Signed)
Performed bladder scan on pt, largest recorded amount was 879 mL.

## 2019-10-12 ENCOUNTER — Other Ambulatory Visit: Payer: Self-pay | Admitting: Adult Health

## 2019-10-26 ENCOUNTER — Other Ambulatory Visit: Payer: Self-pay

## 2019-10-26 MED ORDER — SPIRIVA HANDIHALER 18 MCG IN CAPS: ORAL_CAPSULE | RESPIRATORY_TRACT | 2 refills | Status: DC

## 2019-11-03 ENCOUNTER — Other Ambulatory Visit: Payer: Self-pay | Admitting: Adult Health

## 2019-11-08 ENCOUNTER — Encounter: Payer: Self-pay | Admitting: *Deleted

## 2019-11-08 ENCOUNTER — Emergency Department
Admission: EM | Admit: 2019-11-08 | Discharge: 2019-11-08 | Disposition: A | Discharge status: Home / Self Care | Payer: Medicare Other | Attending: Emergency Medicine | Admitting: Emergency Medicine

## 2019-11-08 ENCOUNTER — Other Ambulatory Visit: Payer: Self-pay

## 2019-11-08 DIAGNOSIS — J449 Chronic obstructive pulmonary disease, unspecified: Secondary | ICD-10-CM | POA: Diagnosis not present

## 2019-11-08 DIAGNOSIS — Z87891 Personal history of nicotine dependence: Secondary | ICD-10-CM | POA: Insufficient documentation

## 2019-11-08 DIAGNOSIS — T839XXA Unspecified complication of genitourinary prosthetic device, implant and graft, initial encounter: Secondary | ICD-10-CM | POA: Insufficient documentation

## 2019-11-08 DIAGNOSIS — Y658 Other specified misadventures during surgical and medical care: Secondary | ICD-10-CM | POA: Insufficient documentation

## 2019-11-08 DIAGNOSIS — R339 Retention of urine, unspecified: Secondary | ICD-10-CM | POA: Diagnosis not present

## 2019-11-08 DIAGNOSIS — Z79899 Other long term (current) drug therapy: Secondary | ICD-10-CM | POA: Insufficient documentation

## 2019-11-08 DIAGNOSIS — I10 Essential (primary) hypertension: Secondary | ICD-10-CM | POA: Diagnosis not present

## 2019-11-08 DIAGNOSIS — N3000 Acute cystitis without hematuria: Secondary | ICD-10-CM

## 2019-11-08 LAB — URINALYSIS, COMPLETE (UACMP) WITH MICROSCOPIC
Bilirubin Urine: NEGATIVE
Glucose, UA: >=500 mg/dL — AB
Ketones, ur: 5 mg/dL — AB
Nitrite: NEGATIVE
Protein, ur: 30 mg/dL — AB
Specific Gravity, Urine: 1.014 (ref 1.005–1.030)
WBC, UA: >50 WBC/hpf — ABNORMAL HIGH (ref 0–5)
pH: 5 (ref 5.0–8.0)

## 2019-11-08 MED ORDER — CEPHALEXIN 500 MG PO CAPS: 500.0000 mg | ORAL_CAPSULE | Freq: Four times a day (QID) | ORAL | 0 refills | Status: AC

## 2019-11-08 MED ORDER — LIDOCAINE HCL (PF) 1 % IJ SOLN
INTRAMUSCULAR | Status: AC
  Filled 2019-11-08: qty 5

## 2019-11-08 MED ORDER — CEFTRIAXONE SODIUM 1 G IJ SOLR
1.0000 g | Freq: Once | INTRAMUSCULAR | Status: DC
  Filled 2019-11-08: qty 10

## 2019-11-08 NOTE — ED Provider Notes (Signed)
[facility] Emergency Department Provider Note  ____________________________________________  Time seen: Approximately 4:04 PM  I have reviewed the triage vital signs and the nursing notes.   HISTORY  Chief Complaint Urinary Retention    HPI Frank Merida. is a 68 y.o. male who presents the emergency department complaining of indwelling catheter.  Patient states that he was seen here in this department  on 29 November.  Patient had urinary retention at the time, had an indwelling catheter placed and was referred to urology.  Patient had been referred to Dr. Apolinar Junes, however was scheduled with an appointment with Dr. Lonna Cobb.  Patient was scheduled for appointment in [facility] and states that he has difficulty obtaining reliable transportation that far.  Patient states that he was approximately 15 minutes late to his appointment, did not like the way he was treated and refuses to see or be treated by this urologist.  Patient is requesting for different urology follow-up.  He states that the catheter is still patent, he still has urine flowing through the indwelling catheter but the leg bag had ruptured and was leaking so he removed it.  Patient states that he feels like he can urinate on his own at this time.  He is on Flomax 0.4 mg daily and states that he had not been instructed to increase dosing here at his last encounter.  Patient is currently seeking care for his indwelling catheter and different urology follow-up.        Past Medical History:  Diagnosis Date  . Arthritis    shoulders  . Asthma   . Benign prostatic hypertrophy   . COPD (chronic obstructive pulmonary disease) (HCC)    chronic bronchitis  . GERD (gastroesophageal reflux disease)   . Hypertension   . Seasonal allergies   . Shortness of breath dyspnea   . Wears partial dentures    upper    Patient Active Problem List   Diagnosis Date Noted  . Substance induced mood disorder (HCC)  08/14/2018  . Psychosis (HCC) 07/17/2018  . Cocaine abuse (HCC) 07/17/2018  . Violent behavior 07/17/2018  . Acute respiratory failure (HCC) 03/31/2018  . Microscopic hematuria 12/19/2017  . Pneumonia 11/26/2017  . Chronic respiratory failure with hypoxia (HCC) 11/26/2017  . Candidal stomatitis 11/26/2017  . Atelectasis 11/26/2017  . Solitary pulmonary nodule 11/26/2017  . Panlobular emphysema (HCC) 11/26/2017  . Chronic obstructive pulmonary disease with acute exacerbation (HCC) 11/26/2017  . 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 Hemoptysis 11/26/2017  . Shortness of breath 11/26/2017  . Hypertension, essential 11/26/2017  . Benign prostatic hyperplasia with lower urinary tract symptoms 03/12/2014    Past Surgical History:  Procedure Laterality Date  . CATARACT EXTRACTION W/PHACO Right 03/30/2015   Procedure: CATARACT EXTRACTION PHACO AND INTRAOCULAR LENS PLACEMENT (IOC);  Surgeon: Lockie Mola, MD;  Location: [facility];  Service: Ophthalmology;  Laterality: Right;  . CIRCUMCISION     as child  . DENTAL SURGERY      Prior to Admission medications   Medication Sig Start Date End Date Taking? Authorizing Provider  ADVAIR DISKUS 500-50 MCG/DOSE AEPB INHALE 1 PUFF INTO THE LUNGS TWICE DAILY 10/11/19   Lyndon Code, MD  albuterol (PROVENTIL) (2.5 MG/3ML) 0.083% nebulizer solution USE 1 VIAL VIA NEBULIZER EVERY 4 TO 6 HOURS AS NEEDED 09/07/19   Johnna Acosta, NP  albuterol (VENTOLIN HFA) 108 (90 Base) MCG/ACT inhaler INHALE 2 PUFFS BY MOUTH EVERY 6 HOURS AS NEEDED FOR WHEEZING  OR SHORTNESS OF BREATH 11/03/19   Kendell Bane, NP  amLODipine (NORVASC) 5 MG tablet Take 5 mg by mouth daily. for high blood pressure 04/02/18   [provider]  azithromycin (ZITHROMAX) 250 MG tablet take as directed 12/25/18   Kendell Bane, NP  cephALEXin (KEFLEX) 500 MG capsule Take 1 capsule (500 mg total) by mouth 4 (four) times daily. 11/08/19    Lakeia Bradshaw, Charline Bills, PA-C  cetirizine (ZYRTEC) 10 MG tablet TAKE 1 TABLET BY MOUTH EVERY DAY 09/07/19   Kendell Bane, NP  DALIRESP 500 MCG TABS tablet TAKE 1 TABLET BY MOUTH EVERY DAY 10/11/19   Lavera Guise, MD  fluticasone [facility]) 50 MCG/ACT nasal spray SHAKE LIQUID AND USE 2 SPRAYS IN EACH NOSTRIL EVERY DAY AS NEEDED FOR SINUS CONGESTION 10/12/19   Kendell Bane, NP  meloxicam (MOBIC) 15 MG tablet Take 15 mg by mouth daily. 03/09/18   [provider]  metoprolol succinate (TOPROL-XL) 25 MG 24 hr tablet Take 25 mg by mouth daily. 02/19/18   [provider]  omeprazole (PRILOSEC) 20 MG capsule Take 20 mg by mouth daily.     [provider]  OXYGEN Inhale 2.5 L into the lungs continuous.    [provider]  predniSONE (DELTASONE) 10 MG tablet TAKE 1 TABLET(10 MG) BY MOUTH DAILY WITH BREAKFAST 10/11/19   Lavera Guise, MD  tamsulosin [facility] 0.4 MG CAPS capsule Take 1 capsule (0.4 mg total) by mouth daily after breakfast. AM 04/09/19   Stoioff, Ronda Fairly, MD  tiotropium (SPIRIVA HANDIHALER) 18 MCG inhalation capsule INHALE 2 PUFFS EVERY DAY AS DIRECTED 10/26/19   Kendell Bane, NP    Allergies Patient has no known allergies.  History reviewed. No pertinent family history.  Social History Social History   Tobacco Use  . Smoking status: Former Smoker    Quit date: 11/12/1984    Years since quitting: 35.0  . Smokeless tobacco: Never Used  Substance Use Topics  . Alcohol use: Yes    Alcohol/week: 3.0 standard drinks    Types: 3 Shots of liquor per week    Comment: pt said "several pints per week"  . Drug use: No     Review of Systems  Constitutional: No fever/chills Eyes: No visual changes. No discharge ENT: No upper respiratory complaints. Cardiovascular: no chest pain. Respiratory: no cough. No SOB. Gastrointestinal: No abdominal pain.  No nausea, no vomiting.  No diarrhea.  No constipation. Genitourinary: Negative for dysuria. No  hematuria.  Care for indwelling catheter.  Patient states catheter is still patent.  Has removed leg bag as it was leaking. Musculoskeletal: Negative for musculoskeletal pain. Skin: Negative for rash, abrasions, lacerations, ecchymosis. Neurological: Negative for headaches, focal weakness or numbness. 10-point ROS otherwise negative.  ____________________________________________   PHYSICAL EXAM:  VITAL SIGNS: ED Triage Vitals  Enc Vitals Group     BP 11/08/19 1531 (!) 147/86     Pulse Rate 11/08/19 1531 78     Resp 11/08/19 1531 (!) 22     Temp 11/08/19 1531 98.3 F (36.8 C)     Temp Source 11/08/19 1531 Oral     SpO2 11/08/19 1531 94 %     Weight 11/08/19 1541 150 lb (68 kg)     Height 11/08/19 1541 6\' 3"  (1.905 m)     Head Circumference --      Peak Flow --      Pain Score 11/08/19 1540 0  Pain Loc --      Pain Edu? --      Excl. in GC? --      Constitutional: Alert and oriented. Well appearing and in no acute distress. Eyes: Conjunctivae are normal. PERRL. EOMI. Head: Atraumatic. ENT:      Ears:       Nose: No congestion/rhinnorhea.      Mouth/Throat: Mucous membranes are moist.  Neck: No stridor.    Cardiovascular: Normal rate, regular rhythm. Normal S1 and S2.  Good peripheral circulation. Respiratory: Normal respiratory effort without tachypnea or retractions. Lungs CTAB. Good air entry to the bases with no decreased or absent breath sounds. Gastrointestinal: Bowel sounds 4 quadrants. Soft and nontender to palpation. No guarding or rigidity. No palpable masses. No distention. No CVA tenderness. Musculoskeletal: Full range of motion to all extremities. No gross deformities appreciated. Neurologic:  Normal speech and language. No gross focal neurologic deficits are appreciated.  Skin:  Skin is warm, dry and intact. No rash noted. Psychiatric: Mood and affect are normal. Speech and behavior are normal. Patient exhibits appropriate insight and  judgement.   ____________________________________________   LABS (all labs ordered are listed, but only abnormal results are displayed)  Labs Reviewed  URINALYSIS, COMPLETE (UACMP) WITH MICROSCOPIC - Abnormal; Notable for the following components:      Result Value   Color, Urine YELLOW (*)    APPearance CLOUDY (*)    Glucose, UA >=500 (*)    Hgb urine dipstick MODERATE (*)    Ketones, ur 5 (*)    Protein, ur 30 (*)    Leukocytes,Ua MODERATE (*)    WBC, UA >50 (*)    Bacteria, UA FEW (*)    All other components within normal limits   ____________________________________________  EKG   ____________________________________________  RADIOLOGY   No results found.  ____________________________________________    PROCEDURES  Procedure(s) performed:    Procedures    Medications  cefTRIAXone (ROCEPHIN) injection 1 g (has no administration in time range)  lidocaine (PF) (XYLOCAINE) 1 % injection (has no administration in time range)     ____________________________________________   INITIAL IMPRESSION / ASSESSMENT AND PLAN / ED COURSE  Pertinent labs & imaging results that were available during my care of the patient were reviewed by me and considered in my medical decision making (see chart for details).  Review of the [place] CSRS was performed in accordance of the [facility] prior to dispensing any controlled drugs.  Clinical Course as of Nov 07 2124  Wynelle LinkSun Nov 08, 2019  1606 Patient presented to emergency department for assessment of his indwelling urinary catheter.  Patient had a catheter placed a month ago in this department, was referred to urology.  Patient states that he was referred to Dr. Apolinar JunesBrandon, however his appointment was made with Dr. Lonna CobbStoioff and [facility].  Patient states that he did not feel this encounter was a good one, does not want to see this urologist in the future.  Patient states that because he was 15 minutes late to his appointment they wanted to  charge him more money and was treated very poorly.  As such the urinary catheter remains in place.  Patient had problems with the leg bag and remove this at home.  He states that he feels like he can urinate on his own at this time.  At this time, treatment plan is to remove indwelling catheter, see if patient can urinate.  Patient can urinate without difficulty he will follow-up with urology  without a new catheter placement.  If patient cannot urinate, new urinary catheter will be placed.   [JC]    Clinical Course User Index [JC] Naw Lasala, Delorise Royals, PA-C          Patient's diagnosis is consistent with urinary retention with Foley catheter problem, acute cystitis.  Patient presented to the emergency department with an indwelling catheter for the past month.  Patient did try to follow-up with urology, had an unpleasant experience who was not seen.  Patient had issues with the leg bag, had disconnected same.  Patient states that he felt like he could urinate.  Foley catheter was removed, patient was able to urinate on his own without difficulty.  At this time no replacement of Foley catheter is indicated.  Patient's urinalysis does reveal white blood cells, hematuria, bacteria.  Patient states that it did have slightly burning with urination after the catheter was removed.  Given this finding with the fact that Foley catheter had been left open, he is symptomatic after having a catheter placed for a month, urinalysis findings I will treat the patient empirically for urinary tract infection.  Patient is given a shot of Rocephin here in the emergency department and will discharge with Keflex.  No flank pain.  No fevers or chills.  At this time I will hold fluoroquinolone unless patient becomes more symptomatic or if Keflex does not appear to be effective.  Patient is instructed to follow-up with urology and he states that he will do so as long as he does not have to see the original urologist.  I have given  him urology here in [place] as a referral.  Patient understands symptoms to return for with worsening dysuria, any urinary retention, flank pain, fevers or chills, abdominal pain..  Follow-up with urology as instructed.  Patient is given ED precautions to return to the ED for any worsening or new symptoms.     ____________________________________________  FINAL CLINICAL IMPRESSION(S) / ED DIAGNOSES  Final diagnoses:  Urinary retention  Problem with Foley catheter, initial encounter (HCC)  Acute cystitis without hematuria      NEW MEDICATIONS STARTED DURING THIS VISIT:  ED Discharge Orders         Ordered    cephALEXin (KEFLEX) 500 MG capsule  4 times daily     11/08/19 2121              This chart was dictated using voice recognition software/Dragon. Despite best efforts to proofread, errors can occur which can change the meaning. Any change was purely unintentional.    Lanette Hampshire 11/08/19 2126    Frank Semen, MD 11/08/19 2221

## 2019-11-08 NOTE — ED Triage Notes (Addendum)
Pt presents w/ initial c/o foley catherter blockage. Pt was unable to f/u outpatient. Pt states foley is flowing now. Pt had foley placed in ED on last visit and has not been able to care for it at home on his own. Pt was wearing paper scrub pants that he was given here a month ago. Catherter attachement device no longer attached and pt had it pinned to the paper scrubs. Pt may need a social work consult. Pt states foley bag was leaking. Pt states he has had no problems w/ urine draining into the foley bag. Pt is requesting a referral to a urologist that is not the prior urologist he was assigned for f/u.

## 2019-11-12 ENCOUNTER — Other Ambulatory Visit: Payer: Self-pay

## 2019-11-12 MED ORDER — PREDNISONE 10 MG PO TABS: ORAL_TABLET | ORAL | 0 refills | Status: DC

## 2019-12-08 ENCOUNTER — Other Ambulatory Visit: Payer: Self-pay | Admitting: Internal Medicine

## 2019-12-22 ENCOUNTER — Ambulatory Visit: Payer: Medicare Other | Admitting: Urology

## 2020-01-21 ENCOUNTER — Other Ambulatory Visit: Payer: Self-pay | Admitting: Adult Health

## 2020-01-28 ENCOUNTER — Other Ambulatory Visit: Payer: Self-pay | Admitting: Adult Health

## 2020-02-19 ENCOUNTER — Other Ambulatory Visit: Payer: Self-pay | Admitting: Adult Health

## 2020-02-20 ENCOUNTER — Other Ambulatory Visit: Payer: Self-pay | Admitting: Internal Medicine

## 2020-02-20 ENCOUNTER — Other Ambulatory Visit: Payer: Self-pay | Admitting: Adult Health

## 2020-02-25 ENCOUNTER — Other Ambulatory Visit: Payer: Self-pay | Admitting: Adult Health

## 2020-02-25 ENCOUNTER — Other Ambulatory Visit: Payer: Self-pay | Admitting: Internal Medicine

## 2020-03-06 ENCOUNTER — Other Ambulatory Visit: Payer: Self-pay

## 2020-03-06 ENCOUNTER — Emergency Department
Admission: EM | Admit: 2020-03-06 | Discharge: 2020-03-06 | Disposition: A | Discharge status: Home / Self Care | Payer: Medicare Other | Attending: Emergency Medicine | Admitting: Emergency Medicine

## 2020-03-06 DIAGNOSIS — Z87891 Personal history of nicotine dependence: Secondary | ICD-10-CM | POA: Diagnosis not present

## 2020-03-06 DIAGNOSIS — I10 Essential (primary) hypertension: Secondary | ICD-10-CM | POA: Insufficient documentation

## 2020-03-06 DIAGNOSIS — J449 Chronic obstructive pulmonary disease, unspecified: Secondary | ICD-10-CM | POA: Insufficient documentation

## 2020-03-06 DIAGNOSIS — R300 Dysuria: Secondary | ICD-10-CM | POA: Diagnosis present

## 2020-03-06 DIAGNOSIS — N3090 Cystitis, unspecified without hematuria: Secondary | ICD-10-CM | POA: Diagnosis not present

## 2020-03-06 LAB — URINALYSIS, COMPLETE (UACMP) WITH MICROSCOPIC
Glucose, UA: NEGATIVE mg/dL
Ketones, ur: >160 mg/dL — AB
Nitrite: NEGATIVE
Protein, ur: 100 mg/dL — AB
Specific Gravity, Urine: 1.025 (ref 1.005–1.030)
pH: 5.5 (ref 5.0–8.0)

## 2020-03-06 LAB — COMPREHENSIVE METABOLIC PANEL
ALT: 13 U/L (ref 0–44)
AST: 17 U/L (ref 15–41)
Albumin: 3.9 g/dL (ref 3.5–5.0)
Alkaline Phosphatase: 85 U/L (ref 38–126)
Anion gap: 19 — ABNORMAL HIGH (ref 5–15)
BUN: 16 mg/dL (ref 8–23)
CO2: 28 mmol/L (ref 22–32)
Calcium: 9.1 mg/dL (ref 8.9–10.3)
Chloride: 93 mmol/L — ABNORMAL LOW (ref 98–111)
Creatinine, Ser: 0.91 mg/dL (ref 0.61–1.24)
GFR calc Af Amer: >60 mL/min (ref >60)
GFR calc non Af Amer: >60 mL/min (ref >60)
Glucose, Bld: 70 mg/dL (ref 70–99)
Potassium: 3.6 mmol/L (ref 3.5–5.1)
Sodium: 140 mmol/L (ref 135–145)
Total Bilirubin: 2.3 mg/dL — ABNORMAL HIGH (ref 0.3–1.2)
Total Protein: 6.8 g/dL (ref 6.5–8.1)

## 2020-03-06 LAB — CBC WITH DIFFERENTIAL/PLATELET
Abs Immature Granulocytes: 0.02 10*3/uL (ref 0.00–0.07)
Basophils Absolute: 0.1 10*3/uL (ref 0.0–0.1)
Basophils Relative: 2 %
Eosinophils Absolute: 0.1 10*3/uL (ref 0.0–0.5)
Eosinophils Relative: 2 %
HCT: 46.5 % (ref 39.0–52.0)
Hemoglobin: 13.6 g/dL (ref 13.0–17.0)
Immature Granulocytes: 0 %
Lymphocytes Relative: 27 %
Lymphs Abs: 2 10*3/uL (ref 0.7–4.0)
MCH: 26.8 pg (ref 26.0–34.0)
MCHC: 29.2 g/dL — ABNORMAL LOW (ref 30.0–36.0)
MCV: 91.7 fL (ref 80.0–100.0)
Monocytes Absolute: 0.5 10*3/uL (ref 0.1–1.0)
Monocytes Relative: 7 %
Neutro Abs: 4.5 10*3/uL (ref 1.7–7.7)
Neutrophils Relative %: 62 %
Platelets: 227 10*3/uL (ref 150–400)
RBC: 5.07 MIL/uL (ref 4.22–5.81)
RDW: 13 % (ref 11.5–15.5)
WBC: 7.3 10*3/uL (ref 4.0–10.5)
nRBC: 0 % (ref 0.0–0.2)

## 2020-03-06 LAB — BASIC METABOLIC PANEL
Anion gap: 14 (ref 5–15)
BUN: 16 mg/dL (ref 8–23)
CO2: 32 mmol/L (ref 22–32)
Calcium: 8.6 mg/dL — ABNORMAL LOW (ref 8.9–10.3)
Chloride: 94 mmol/L — ABNORMAL LOW (ref 98–111)
Creatinine, Ser: 0.95 mg/dL (ref 0.61–1.24)
GFR calc Af Amer: >60 mL/min (ref >60)
GFR calc non Af Amer: >60 mL/min (ref >60)
Glucose, Bld: 117 mg/dL — ABNORMAL HIGH (ref 70–99)
Potassium: 3.6 mmol/L (ref 3.5–5.1)
Sodium: 140 mmol/L (ref 135–145)

## 2020-03-06 MED ORDER — CIPROFLOXACIN HCL 500 MG PO TABS
500.0000 mg | ORAL_TABLET | Freq: Two times a day (BID) | ORAL | 0 refills | Status: AC
Start: 2020-03-06 — End: 2020-03-16

## 2020-03-06 MED ORDER — SODIUM CHLORIDE 0.9 % IV BOLUS
500.0000 mL | Freq: Once | INTRAVENOUS | Status: AC
  Administered 2020-03-06: 500 mL via INTRAVENOUS

## 2020-03-06 MED ORDER — SODIUM CHLORIDE 0.9 % IV SOLN
1.0000 g | Freq: Once | INTRAVENOUS | Status: AC
  Administered 2020-03-06: 1 g via INTRAVENOUS
  Filled 2020-03-06: qty 10

## 2020-03-06 NOTE — ED Provider Notes (Signed)
[facility] Emergency Department Provider Note       Time seen: ----------------------------------------- 7:06 PM on 03/06/2020 -----------------------------------------   I have reviewed the triage vital signs and the nursing notes.  HISTORY   Chief Complaint Dysuria    HPI Frank Todd. is a 69 y.o. male with a history of arthritis, asthma, COPD, GERD, hypertension who presents to the ED for dysuria.  No clear etiology as to why he was brought in by EMS.  He denies any complaints other than dysuria.  He denies fevers, chills or other complaints.  Past Medical History:  Diagnosis Date  . Arthritis    shoulders  . Asthma   . Benign prostatic hypertrophy   . COPD (chronic obstructive pulmonary disease) (HCC)    chronic bronchitis  . GERD (gastroesophageal reflux disease)   . Hypertension   . Seasonal allergies   . Shortness of breath dyspnea   . Wears partial dentures    upper    Patient Active Problem List   Diagnosis Date Noted  . Substance induced mood disorder (HCC) 08/14/2018  . Psychosis (HCC) 07/17/2018  . Cocaine abuse (HCC) 07/17/2018  . Violent behavior 07/17/2018  . Acute respiratory failure (HCC) 03/31/2018  . Microscopic hematuria 12/19/2017  . Pneumonia 11/26/2017  . Chronic respiratory failure with hypoxia (HCC) 11/26/2017  . Candidal stomatitis 11/26/2017  . Atelectasis 11/26/2017  . Solitary pulmonary nodule 11/26/2017  . Panlobular emphysema (HCC) 11/26/2017  . Chronic obstructive pulmonary disease with acute exacerbation (HCC) 11/26/2017  . 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 Hemoptysis 11/26/2017  . Shortness of breath 11/26/2017  . Hypertension, essential 11/26/2017  . Benign prostatic hyperplasia with lower urinary tract symptoms 03/12/2014    Past Surgical History:  Procedure Laterality Date  . CATARACT EXTRACTION W/PHACO Right 03/30/2015   Procedure: CATARACT EXTRACTION PHACO AND  INTRAOCULAR LENS PLACEMENT (IOC);  Surgeon: Lockie Mola, MD;  Location: [facility];  Service: Ophthalmology;  Laterality: Right;  . CIRCUMCISION     as child  . DENTAL SURGERY      Allergies Patient has no known allergies.  Social History Social History   Tobacco Use  . Smoking status: Former Smoker    Quit date: 11/12/1984    Years since quitting: 35.3  . Smokeless tobacco: Never Used  Substance Use Topics  . Alcohol use: Yes    Alcohol/week: 3.0 standard drinks    Types: 3 Shots of liquor per week    Comment: pt said "several pints per week"  . Drug use: No    Review of Systems Constitutional: Negative for fever. Cardiovascular: Negative for chest pain. Respiratory: Negative for shortness of breath. Gastrointestinal: Negative for abdominal pain, vomiting and diarrhea. Genitourinary: Positive for dysuria Musculoskeletal: Negative for back pain. Skin: Negative for rash. Neurological: Negative for headaches, focal weakness or numbness.  All systems negative/normal/unremarkable except as stated in the HPI  ____________________________________________   PHYSICAL EXAM:  VITAL SIGNS: ED Triage Vitals [03/06/20 1901]  Enc Vitals Group     BP      Pulse      Resp      Temp 98.1 F (36.7 C)     Temp Source Oral     SpO2      Weight 140 lb (63.5 kg)     Height 6\' 3"  (1.905 m)     Head Circumference      Peak Flow      Pain Score 0  Pain Loc      Pain Edu?      Excl. in GC?     Constitutional: Alert and oriented.  No acute distress Eyes: Conjunctivae are normal. Normal extraocular movements. ENT      Head: Normocephalic and atraumatic.      Nose: No congestion/rhinnorhea.      Mouth/Throat: Mucous membranes are moist.      Neck: No stridor. Cardiovascular: Normal rate, regular rhythm. No murmurs, rubs, or gallops. Respiratory: Normal respiratory effort without tachypnea nor retractions. Breath sounds are clear and equal bilaterally. No  wheezes/rales/rhonchi. Gastrointestinal: Soft and nontender. Normal bowel sounds Musculoskeletal: Nontender with normal range of motion in extremities. No lower extremity tenderness nor edema. Neurologic:  Normal speech and language. No gross focal neurologic deficits are appreciated.  Skin:  Skin is warm, dry and intact. No rash noted. Psychiatric: Mood and affect are normal. Speech and behavior are normal.  ____________________________________________  EKG: Interpreted by me.  Sinus rhythm rate of 67 bpm, LVH with repolarization abnormality, normal QT  ____________________________________________  ED COURSE:  As part of my medical decision making, I reviewed the following data within the electronic MEDICAL RECORD NUMBER History obtained from family if available, nursing notes, old chart and ekg, as well as notes from prior ED visits. Patient presented for dysuria, we will assess with labs and imaging as indicated at this time.   Procedures  [name] was evaluated in Emergency Department on 03/06/2020 for the symptoms described in the history of present illness. He was evaluated in the context of the global COVID-19 pandemic, which necessitated consideration that the patient might be at risk for infection with the SARS-CoV-2 virus that causes COVID-19. Institutional protocols and algorithms that pertain to the evaluation of patients at risk for COVID-19 are in a state of rapid change based on information released by regulatory bodies including the CDC and federal and state organizations. These policies and algorithms were followed during the patient's care in the ED.  ____________________________________________   LABS (pertinent positives/negatives)  Labs Reviewed  CBC WITH DIFFERENTIAL/PLATELET - Abnormal; Notable for the following components:      Result Value   MCHC 29.2 (*)    All other components within normal limits  COMPREHENSIVE METABOLIC PANEL - Abnormal; Notable for the  following components:   Chloride 93 (*)    Total Bilirubin 2.3 (*)    Anion gap 19 (*)    All other components within normal limits  URINALYSIS, COMPLETE (UACMP) WITH MICROSCOPIC - Abnormal; Notable for the following components:   Hgb urine dipstick MODERATE (*)    Bilirubin Urine SMALL (*)    Ketones, ur >160 (*)    Protein, ur 100 (*)    Leukocytes,Ua TRACE (*)    Bacteria, UA RARE (*)    All other components within normal limits  BASIC METABOLIC PANEL - Abnormal; Notable for the following components:   Chloride 94 (*)    Glucose, Bld 117 (*)    Calcium 8.6 (*)    All other components within normal limits  URINE CULTURE   ____________________________________________   DIFFERENTIAL DIAGNOSIS   UTI, pyelonephritis, dehydration, electrolyte abnormality, occult infection  FINAL ASSESSMENT AND PLAN  UTI   Plan: The patient had presented for dysuria. Patient's labs initially revealed an elevated anion gap of uncertain significance any urinary tract infection.  He was given fluids and IV antibiotics.  On repeat evaluation his chemistries had improved.  He be discharged with antibiotics and is  cleared for outpatient follow-up.   Laurence Aly, MD    Note: This note was generated in part or whole with voice recognition software. Voice recognition is usually quite accurate but there are transcription errors that can and very often do occur. I apologize for any typographical errors that were not detected and corrected.     Earleen Newport, MD 03/06/20 2250

## 2020-03-06 NOTE — ED Notes (Signed)
This tech and Florentina Addison, Charity fundraiser provided pt with pericare and linen change. External device applied.

## 2020-03-06 NOTE — ED Triage Notes (Signed)
Pt here via [facility] from home.   Neighbor called EMS , pt states he wanted his neighbor to contact his sister but they called EMS. Pt is unsure why EMS were called. Pt does report that he has been urinating frequently and has had some urinary incontinence, also reports burning.    cbg 89, p afib w/hx of afib hr 80-120, O2 98% on 4L (chronic unsure on baseline O2), bp 152/89.

## 2020-03-06 NOTE — ED Notes (Signed)
Pt unable to sign for discharge d/t signature pad not working. Pt verbalizes instructions for discharge. Pt d/c with EMS to home at this time.

## 2020-03-06 NOTE — ED Notes (Signed)
Pt given sandwich tray 

## 2020-03-07 LAB — URINE CULTURE

## 2020-03-15 ENCOUNTER — Ambulatory Visit: Payer: Medicare Other | Admitting: Internal Medicine

## 2020-03-21 ENCOUNTER — Other Ambulatory Visit: Payer: Self-pay | Admitting: Adult Health

## 2020-03-21 ENCOUNTER — Other Ambulatory Visit: Payer: Self-pay | Admitting: Internal Medicine

## 2020-03-21 DIAGNOSIS — J449 Chronic obstructive pulmonary disease, unspecified: Secondary | ICD-10-CM

## 2020-03-28 ENCOUNTER — Other Ambulatory Visit: Payer: Self-pay | Admitting: Adult Health

## 2020-03-28 NOTE — Telephone Encounter (Signed)
Pt need appt for refills  ?

## 2020-04-12 DEATH — deceased

## 2020-07-29 IMAGING — CR DG CHEST 2V
1 series · 2 of 2 positions shown · non-contrast
Comparison: Chest x-ray dated 03/31/2018.

CLINICAL DATA: Swollen ankles. History of hypertension, COPD,
asthma.

EXAM:
CHEST - 2 VIEW

[Series 1: dg chest 2 view · 0.14mm/px · 2 of 2 slices shown]
[im 1/2]
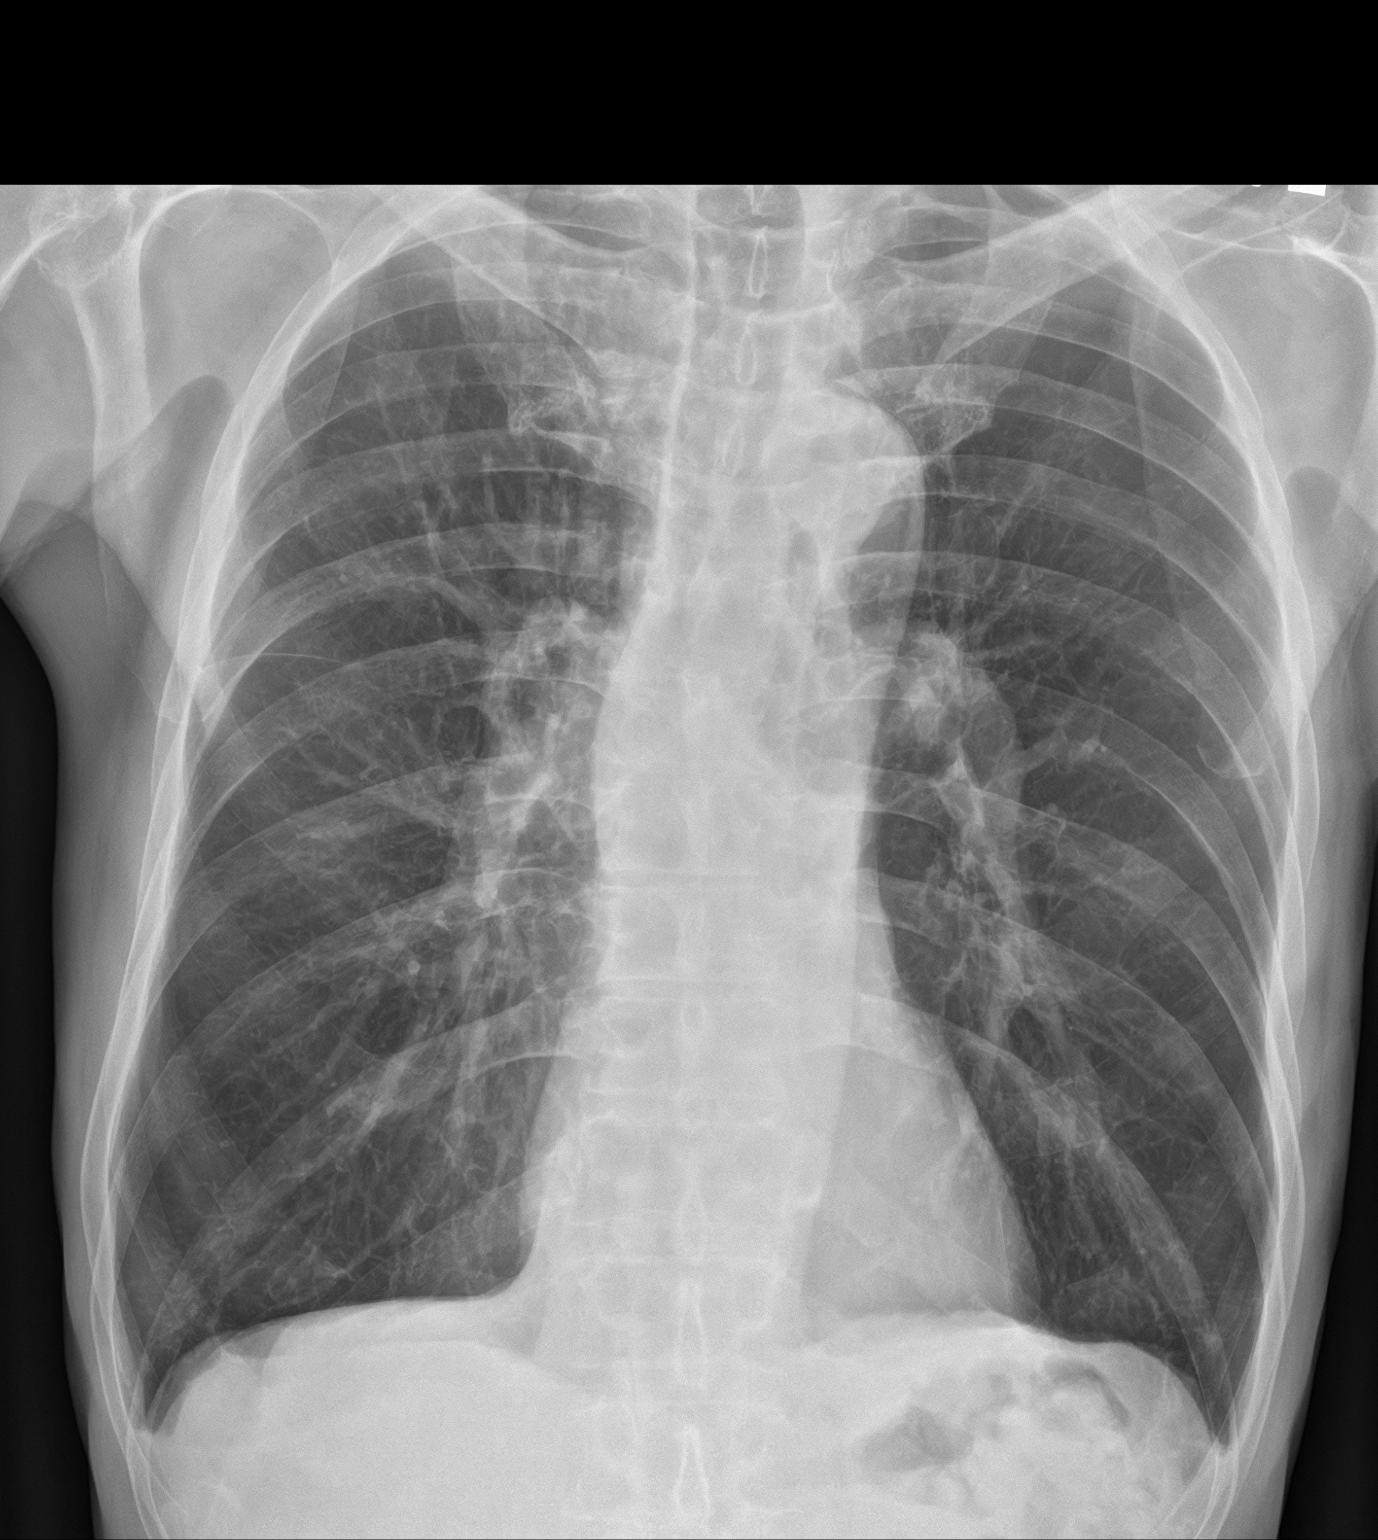
[im 2/2]
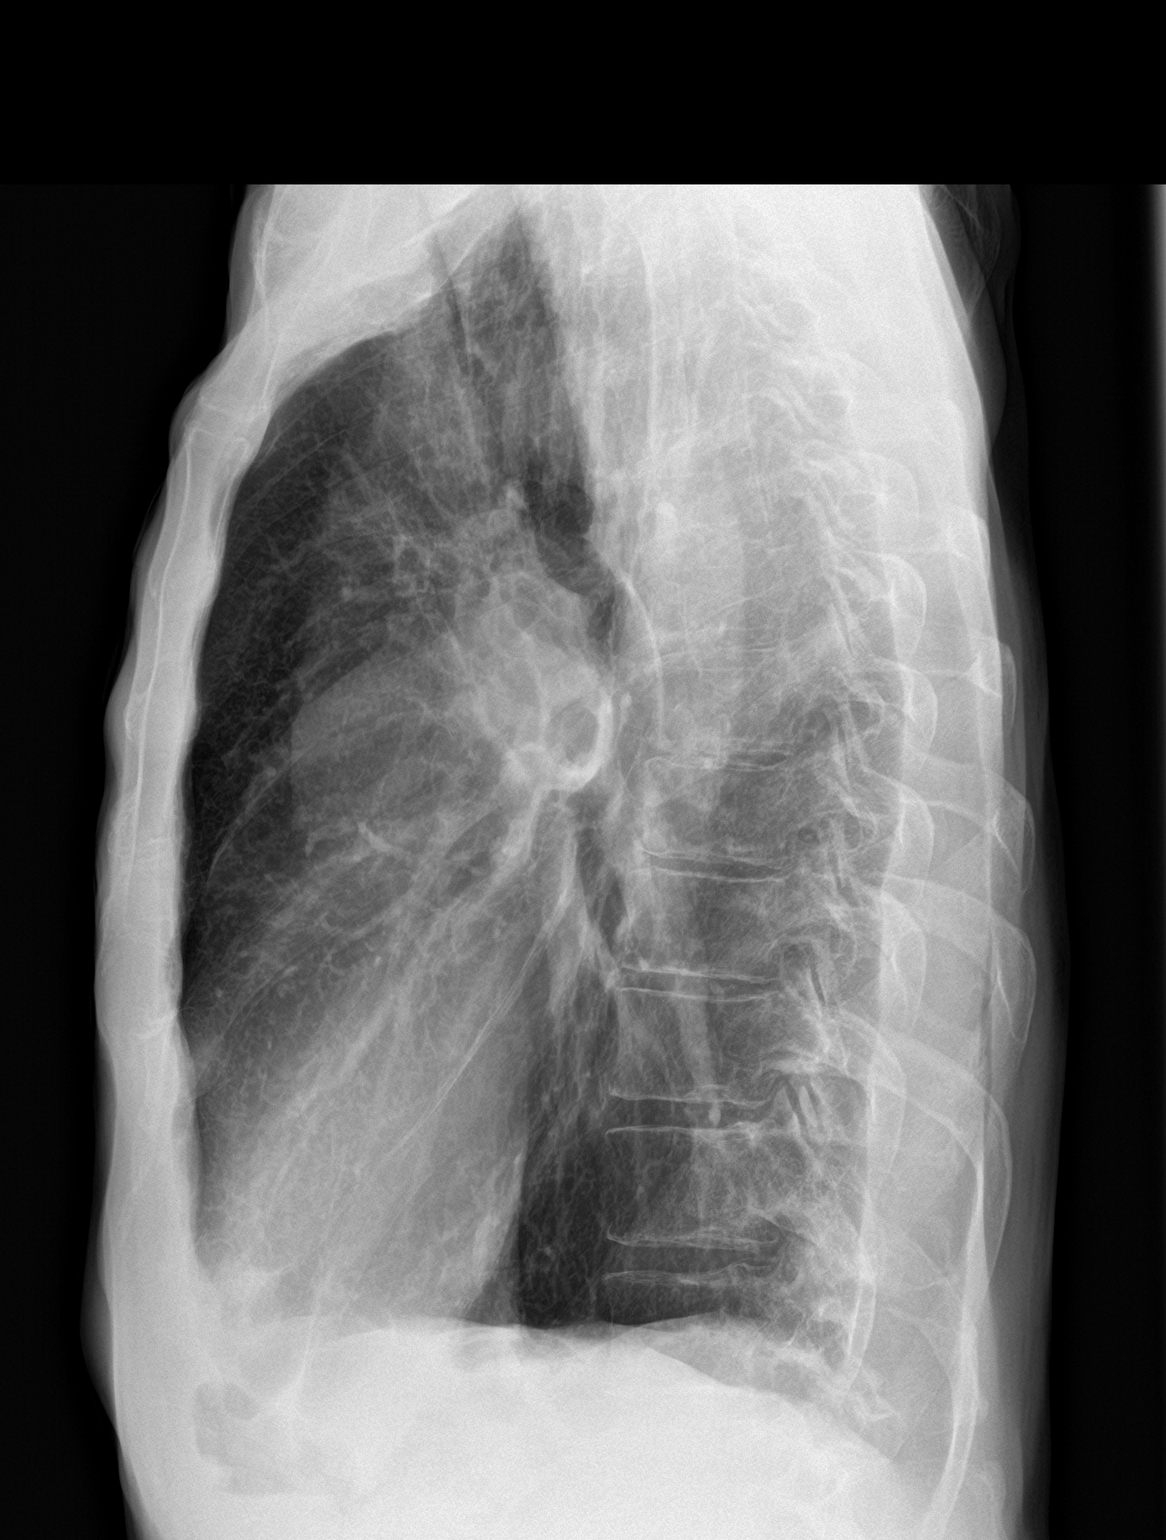

[2 of 2 positions shown; findings below may reference images not displayed]

FINDINGS: Lungs are hyperexpanded, consistent with the given history of COPD.
Associated chronic bronchitic changes centrally. No confluent
opacity to suggest a developing pneumonia. No pulmonary edema,
pleural effusion or pneumothorax seen.

Heart size and mediastinal contours are stable. No acute or
suspicious osseous findings.
IMPRESSION: No active cardiopulmonary disease. No evidence of pneumonia or
pulmonary edema.
